# Patient Record
Sex: Female | Born: 1975 | Race: White | Hispanic: Yes | Marital: Married | Smoking: Current some day smoker
Health system: Southern US, Community
[De-identification: ages and names within clinical notes are randomized; demographics above are authoritative.]

## PROBLEM LIST (undated history)

## (undated) DIAGNOSIS — F329 Major depressive disorder, single episode, unspecified: Secondary | ICD-10-CM

## (undated) DIAGNOSIS — Z973 Presence of spectacles and contact lenses: Secondary | ICD-10-CM

## (undated) DIAGNOSIS — E559 Vitamin D deficiency, unspecified: Secondary | ICD-10-CM

## (undated) DIAGNOSIS — N939 Abnormal uterine and vaginal bleeding, unspecified: Secondary | ICD-10-CM

## (undated) DIAGNOSIS — N766 Ulceration of vulva: Secondary | ICD-10-CM

## (undated) DIAGNOSIS — N912 Amenorrhea, unspecified: Secondary | ICD-10-CM

## (undated) DIAGNOSIS — J310 Chronic rhinitis: Secondary | ICD-10-CM

## (undated) DIAGNOSIS — A6000 Herpesviral infection of urogenital system, unspecified: Secondary | ICD-10-CM

## (undated) DIAGNOSIS — E785 Hyperlipidemia, unspecified: Secondary | ICD-10-CM

## (undated) DIAGNOSIS — K219 Gastro-esophageal reflux disease without esophagitis: Secondary | ICD-10-CM

## (undated) DIAGNOSIS — K589 Irritable bowel syndrome without diarrhea: Secondary | ICD-10-CM

## (undated) DIAGNOSIS — M8580 Other specified disorders of bone density and structure, unspecified site: Secondary | ICD-10-CM

## (undated) DIAGNOSIS — F32A Depression, unspecified: Secondary | ICD-10-CM

## (undated) DIAGNOSIS — E0590 Thyrotoxicosis, unspecified without thyrotoxic crisis or storm: Secondary | ICD-10-CM

## (undated) DIAGNOSIS — N393 Stress incontinence (female) (male): Secondary | ICD-10-CM

## (undated) DIAGNOSIS — D649 Anemia, unspecified: Secondary | ICD-10-CM

## (undated) DIAGNOSIS — Z8639 Personal history of other endocrine, nutritional and metabolic disease: Secondary | ICD-10-CM

## (undated) DIAGNOSIS — F419 Anxiety disorder, unspecified: Secondary | ICD-10-CM

## (undated) HISTORY — DX: Chronic rhinitis: J31.0

## (undated) HISTORY — DX: Depression, unspecified: F32.A

## (undated) HISTORY — DX: Major depressive disorder, single episode, unspecified: F32.9

## (undated) HISTORY — DX: Vitamin D deficiency, unspecified: E55.9

## (undated) HISTORY — DX: Abnormal uterine and vaginal bleeding, unspecified: N93.9

## (undated) HISTORY — DX: Irritable bowel syndrome without diarrhea: K58.9

## (undated) HISTORY — DX: Anemia, unspecified: D64.9

## (undated) HISTORY — DX: Ulceration of vulva: N76.6

## (undated) HISTORY — DX: Other specified disorders of bone density and structure, unspecified site: M85.80

## (undated) HISTORY — DX: Stress incontinence (female) (male): N39.3

## (undated) HISTORY — DX: Anxiety disorder, unspecified: F41.9

## (undated) HISTORY — DX: Gastro-esophageal reflux disease without esophagitis: K21.9

## (undated) HISTORY — DX: Herpesviral infection of urogenital system, unspecified: A60.00

## (undated) HISTORY — DX: Thyrotoxicosis, unspecified without thyrotoxic crisis or storm: E05.90

## (undated) HISTORY — DX: Hyperlipidemia, unspecified: E78.5

## (undated) HISTORY — PX: NO PAST SURGERIES: SHX2092

## (undated) HISTORY — DX: Amenorrhea, unspecified: N91.2

---

## 2007-10-20 ENCOUNTER — Emergency Department (HOSPITAL_COMMUNITY): Admission: EM | Admit: 2007-10-20 | Discharge: 2007-10-20 | Payer: Self-pay | Admitting: Emergency Medicine

## 2008-01-23 ENCOUNTER — Emergency Department (HOSPITAL_COMMUNITY): Admission: EM | Admit: 2008-01-23 | Discharge: 2008-01-24 | Payer: Self-pay | Admitting: Emergency Medicine

## 2008-09-15 ENCOUNTER — Encounter: Payer: Self-pay | Admitting: Endocrinology

## 2008-10-08 ENCOUNTER — Encounter: Payer: Self-pay | Admitting: Endocrinology

## 2009-02-06 ENCOUNTER — Emergency Department (HOSPITAL_BASED_OUTPATIENT_CLINIC_OR_DEPARTMENT_OTHER): Admission: EM | Admit: 2009-02-06 | Discharge: 2009-02-06 | Payer: Self-pay | Admitting: Emergency Medicine

## 2009-02-06 ENCOUNTER — Ambulatory Visit: Payer: Self-pay | Admitting: Diagnostic Radiology

## 2009-02-06 ENCOUNTER — Emergency Department (HOSPITAL_COMMUNITY): Admission: EM | Admit: 2009-02-06 | Discharge: 2009-02-06 | Payer: Self-pay | Admitting: Emergency Medicine

## 2009-02-07 ENCOUNTER — Ambulatory Visit: Payer: Self-pay | Admitting: Radiology

## 2009-02-07 ENCOUNTER — Emergency Department (HOSPITAL_BASED_OUTPATIENT_CLINIC_OR_DEPARTMENT_OTHER): Admission: EM | Admit: 2009-02-07 | Discharge: 2009-02-07 | Payer: Self-pay | Admitting: Emergency Medicine

## 2009-02-23 ENCOUNTER — Ambulatory Visit: Payer: Self-pay | Admitting: Endocrinology

## 2009-02-23 DIAGNOSIS — A6000 Herpesviral infection of urogenital system, unspecified: Secondary | ICD-10-CM | POA: Insufficient documentation

## 2009-02-23 DIAGNOSIS — E042 Nontoxic multinodular goiter: Secondary | ICD-10-CM

## 2009-02-23 DIAGNOSIS — E0590 Thyrotoxicosis, unspecified without thyrotoxic crisis or storm: Secondary | ICD-10-CM | POA: Insufficient documentation

## 2009-02-23 DIAGNOSIS — K219 Gastro-esophageal reflux disease without esophagitis: Secondary | ICD-10-CM

## 2009-10-05 ENCOUNTER — Emergency Department (HOSPITAL_BASED_OUTPATIENT_CLINIC_OR_DEPARTMENT_OTHER): Admission: EM | Admit: 2009-10-05 | Discharge: 2009-10-05 | Payer: Self-pay | Admitting: Emergency Medicine

## 2010-09-16 LAB — URINALYSIS, ROUTINE W REFLEX MICROSCOPIC
Glucose, UA: NEGATIVE mg/dL
Hgb urine dipstick: NEGATIVE
Protein, ur: NEGATIVE mg/dL
Specific Gravity, Urine: 1.017 (ref 1.005–1.030)
pH: 8 (ref 5.0–8.0)

## 2010-09-16 LAB — COMPREHENSIVE METABOLIC PANEL
Albumin: 3.8 g/dL (ref 3.5–5.2)
Alkaline Phosphatase: 65 U/L (ref 39–117)
BUN: 12 mg/dL (ref 6–23)
CO2: 26 mEq/L (ref 19–32)
Chloride: 105 mEq/L (ref 96–112)
Creatinine, Ser: 0.7 mg/dL (ref 0.4–1.2)
GFR calc non Af Amer: >60 mL/min (ref >60)
Potassium: 3.9 mEq/L (ref 3.5–5.1)
Total Bilirubin: 0.3 mg/dL (ref 0.3–1.2)

## 2010-09-16 LAB — CBC
HCT: 38.3 % (ref 36.0–46.0)
Hemoglobin: 13.3 g/dL (ref 12.0–15.0)
MCV: 89 fL (ref 78.0–100.0)
RBC: 4.3 MIL/uL (ref 3.87–5.11)
WBC: 9.1 10*3/uL (ref 4.0–10.5)

## 2010-09-16 LAB — DIFFERENTIAL
Basophils Absolute: 0.1 10*3/uL (ref 0.0–0.1)
Basophils Relative: 1 % (ref 0–1)
Eosinophils Relative: 1 % (ref 0–5)
Lymphocytes Relative: 20 % (ref 12–46)
Monocytes Absolute: 0.5 10*3/uL (ref 0.1–1.0)
Neutro Abs: 6.6 10*3/uL (ref 1.7–7.7)

## 2010-09-16 LAB — LIPASE, BLOOD: Lipase: 36 U/L (ref 23–300)

## 2010-09-16 LAB — WET PREP, GENITAL

## 2010-09-16 LAB — URINE CULTURE
Colony Count: NO GROWTH
Culture: NO GROWTH

## 2011-03-09 LAB — CBC
MCHC: 33.7
MCV: 90.9
Platelets: 242
RBC: 3.86 — ABNORMAL LOW
WBC: 9.4

## 2011-06-04 ENCOUNTER — Emergency Department (HOSPITAL_COMMUNITY)
Admission: EM | Admit: 2011-06-04 | Discharge: 2011-06-04 | Disposition: A | Discharge status: Home / Self Care | Payer: BC Managed Care – PPO | Attending: Emergency Medicine | Admitting: Emergency Medicine

## 2011-06-04 ENCOUNTER — Encounter: Payer: Self-pay | Admitting: Emergency Medicine

## 2011-06-04 DIAGNOSIS — R51 Headache: Secondary | ICD-10-CM | POA: Insufficient documentation

## 2011-06-04 DIAGNOSIS — J069 Acute upper respiratory infection, unspecified: Secondary | ICD-10-CM | POA: Insufficient documentation

## 2011-06-04 DIAGNOSIS — R05 Cough: Secondary | ICD-10-CM | POA: Insufficient documentation

## 2011-06-04 DIAGNOSIS — R509 Fever, unspecified: Secondary | ICD-10-CM | POA: Insufficient documentation

## 2011-06-04 DIAGNOSIS — R059 Cough, unspecified: Secondary | ICD-10-CM | POA: Insufficient documentation

## 2011-06-04 MED ORDER — HYDROCOD POLST-CHLORPHEN POLST 10-8 MG/5ML PO LQCR: 5.0000 mL | Freq: Two times a day (BID) | ORAL | Status: DC | PRN

## 2011-06-04 MED ORDER — AZITHROMYCIN 250 MG PO TABS: 250.0000 mg | ORAL_TABLET | Freq: Every day | ORAL | Status: AC

## 2011-06-04 NOTE — ED Provider Notes (Signed)
History     CSN: [id number]  Arrival date & time 06/04/11  1191   First MD Initiated Contact with Patient 06/04/11 0756      Chief Complaint  Patient presents with  . Influenza    (Consider location/radiation/quality/duration/timing/severity/associated sxs/prior treatment) Patient is a 35 y.o. female presenting with flu symptoms. The history is provided by the patient.  Influenza     Pt presents to the ED with cough for 5 days. Pt states that she has cough and fever with headache. Denies sore throat, muscles aches, vomiting, diarrhea, nasal congestion. She has been eating and drinking. History reviewed. No pertinent past medical history.  History reviewed. No pertinent past surgical history.  History reviewed. No pertinent family history.  History  Substance Use Topics  . Smoking status: Not on file  . Smokeless tobacco: Not on file  . Alcohol Use: Not on file    OB History    Grav Para Term Preterm Abortions TAB SAB Ect Mult Living                  Review of Systems  All other systems reviewed and are negative.    Allergies  Review of patient's allergies indicates no known allergies.  Home Medications   Current Outpatient Rx  Name Route Sig Dispense Refill  . AZITHROMYCIN 250 MG PO TABS Oral Take 1 tablet (250 mg total) by mouth daily. Take first 2 tablets together, then 1 every day until finished. 6 tablet 0  . HYDROCOD POLST-CHLORPHEN POLST 10-8 MG/5ML PO LQCR Oral Take 5 mLs by mouth every 12 (twelve) hours as needed. 140 mL 0    BP 115/73  Pulse 111  Temp(Src) 98.6 F (37 C) (Oral)  Resp 22  Wt 127 lb 12.8 oz (57.97 kg)  SpO2 100%  LMP 05/28/2011  Physical Exam  Constitutional: She is oriented to person, place, and time. She appears well-developed and well-nourished.  HENT:  Head: Normocephalic and atraumatic.  Eyes: Conjunctivae are normal. Pupils are equal, round, and reactive to light.  Neck: Trachea normal, normal range of motion and  full passive range of motion without pain. Neck supple.  Cardiovascular: Normal rate, regular rhythm and normal pulses.   Pulmonary/Chest: Effort normal and breath sounds normal. No respiratory distress. She has no wheezes. She has no rales. Chest wall is not dull to percussion. She exhibits no tenderness, no crepitus, no edema, no deformity and no retraction.       Pt coughing during exam which sounds congested. Lung sounds clear to ascultation   Abdominal: Soft. Normal appearance and bowel sounds are normal.  Musculoskeletal: Normal range of motion.  Neurological: She is oriented to person, place, and time. She has normal strength.  Skin: Skin is warm, dry and intact.  Psychiatric: She has a normal mood and affect. Her speech is normal. Cognition and memory are normal.    ED Course  Procedures (including critical care time)  Labs Reviewed - No data to display No results found.   1. URI (upper respiratory infection)       MDM  Pt and her son both have same symptoms. Fevers with cough. Will treat with Azithromycin and Tussionex.        Dorthula Matas, PA 06/04/11 [phone]

## 2011-06-04 NOTE — ED Notes (Signed)
Flu symptoms for 4 days aching, coughing, fever headache

## 2011-06-04 NOTE — ED Notes (Signed)
Family at bedside. 

## 2011-06-05 NOTE — ED Provider Notes (Signed)
Medical screening examination/treatment/procedure(s) were performed by non-physician practitioner and as supervising physician I was immediately available for consultation/collaboration.  Myca Perno P Sherion Dooly, MD 06/05/11 0707 

## 2015-07-21 ENCOUNTER — Encounter: Payer: Self-pay | Admitting: Gastroenterology

## 2015-08-28 ENCOUNTER — Emergency Department (INDEPENDENT_AMBULATORY_CARE_PROVIDER_SITE_OTHER)
Admission: EM | Admit: 2015-08-28 | Discharge: 2015-08-28 | Disposition: A | Discharge status: Home / Self Care | Payer: Self-pay | Source: Home / Self Care | Attending: Emergency Medicine | Admitting: Emergency Medicine

## 2015-08-28 ENCOUNTER — Encounter (HOSPITAL_COMMUNITY): Payer: Self-pay | Admitting: Emergency Medicine

## 2015-08-28 DIAGNOSIS — S29012A Strain of muscle and tendon of back wall of thorax, initial encounter: Secondary | ICD-10-CM

## 2015-08-28 DIAGNOSIS — S39012A Strain of muscle, fascia and tendon of lower back, initial encounter: Secondary | ICD-10-CM

## 2015-08-28 MED ORDER — TRAMADOL HCL 50 MG PO TABS: 50.0000 mg | ORAL_TABLET | Freq: Four times a day (QID) | ORAL | Status: DC | PRN

## 2015-08-28 MED ORDER — CYCLOBENZAPRINE HCL 5 MG PO TABS: 5.0000 mg | ORAL_TABLET | Freq: Three times a day (TID) | ORAL | Status: DC | PRN
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
## 2015-08-28 NOTE — Discharge Instructions (Signed)
You have spasm and strain in your left back muscles. Take the Aleve regularly for the next several days. Apply heat as often as you can. Use Flexeril 3 times a day as needed for muscle spasm and pain. This medicine will make you drowsy. Use the tramadol for severe pain. Do not drive felt taking this medicine. You should see gradual improvement over the next week. Follow-up as needed.
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
## 2015-09-09 ENCOUNTER — Ambulatory Visit: Payer: Self-pay | Admitting: Gastroenterology

## 2015-10-21 ENCOUNTER — Other Ambulatory Visit: Payer: Self-pay | Admitting: Internal Medicine

## 2015-10-21 DIAGNOSIS — Z1231 Encounter for screening mammogram for malignant neoplasm of breast: Secondary | ICD-10-CM

## 2015-11-02 ENCOUNTER — Ambulatory Visit: Payer: Self-pay

## 2015-11-03 ENCOUNTER — Other Ambulatory Visit: Payer: Self-pay

## 2015-11-03 ENCOUNTER — Ambulatory Visit: Payer: Self-pay | Admitting: Gastroenterology

## 2015-11-11 ENCOUNTER — Ambulatory Visit
Admission: RE | Admit: 2015-11-11 | Discharge: 2015-11-11 | Disposition: A | Discharge status: Home / Self Care | Payer: BLUE CROSS/BLUE SHIELD | Source: Ambulatory Visit | Attending: Internal Medicine | Admitting: Internal Medicine

## 2015-11-11 DIAGNOSIS — Z1231 Encounter for screening mammogram for malignant neoplasm of breast: Secondary | ICD-10-CM

## 2016-01-06 ENCOUNTER — Encounter: Payer: Self-pay | Admitting: Gastroenterology

## 2016-03-13 ENCOUNTER — Other Ambulatory Visit: Payer: Self-pay

## 2016-03-13 ENCOUNTER — Ambulatory Visit: Payer: Self-pay | Admitting: Gastroenterology

## 2016-12-19 DIAGNOSIS — D649 Anemia, unspecified: Secondary | ICD-10-CM | POA: Diagnosis not present

## 2016-12-19 DIAGNOSIS — E039 Hypothyroidism, unspecified: Secondary | ICD-10-CM | POA: Insufficient documentation

## 2016-12-19 DIAGNOSIS — R112 Nausea with vomiting, unspecified: Secondary | ICD-10-CM | POA: Insufficient documentation

## 2016-12-19 DIAGNOSIS — R51 Headache: Secondary | ICD-10-CM | POA: Insufficient documentation

## 2016-12-19 DIAGNOSIS — F17210 Nicotine dependence, cigarettes, uncomplicated: Secondary | ICD-10-CM | POA: Diagnosis not present

## 2016-12-19 DIAGNOSIS — Z79899 Other long term (current) drug therapy: Secondary | ICD-10-CM | POA: Diagnosis not present

## 2016-12-20 ENCOUNTER — Encounter (HOSPITAL_COMMUNITY): Payer: Self-pay | Admitting: Emergency Medicine

## 2016-12-20 ENCOUNTER — Emergency Department (HOSPITAL_COMMUNITY)
Admission: EM | Admit: 2016-12-20 | Discharge: 2016-12-20 | Disposition: A | Discharge status: Home / Self Care | Payer: BLUE CROSS/BLUE SHIELD | Attending: Emergency Medicine | Admitting: Emergency Medicine

## 2016-12-20 DIAGNOSIS — R51 Headache: Secondary | ICD-10-CM

## 2016-12-20 DIAGNOSIS — R519 Headache, unspecified: Secondary | ICD-10-CM

## 2016-12-20 LAB — CBC WITH DIFFERENTIAL/PLATELET
Basophils Absolute: 0 10*3/uL (ref 0.0–0.1)
Basophils Relative: 0 %
EOS ABS: 0 10*3/uL (ref 0.0–0.7)
Eosinophils Relative: 0 %
HEMATOCRIT: 40.9 % (ref 36.0–46.0)
HEMOGLOBIN: 13.4 g/dL (ref 12.0–15.0)
LYMPHS ABS: 1.3 10*3/uL (ref 0.7–4.0)
Lymphocytes Relative: 9 %
MCH: 29.6 pg (ref 26.0–34.0)
MCHC: 32.8 g/dL (ref 30.0–36.0)
MCV: 90.5 fL (ref 78.0–100.0)
MONO ABS: 0.9 10*3/uL (ref 0.1–1.0)
MONOS PCT: 6 %
NEUTROS ABS: 12 10*3/uL — AB (ref 1.7–7.7)
NEUTROS PCT: 85 %
Platelets: 292 10*3/uL (ref 150–400)
RBC: 4.52 MIL/uL (ref 3.87–5.11)
RDW: 13.1 % (ref 11.5–15.5)
WBC: 14.3 10*3/uL — ABNORMAL HIGH (ref 4.0–10.5)

## 2016-12-20 LAB — BASIC METABOLIC PANEL
Anion gap: 11 (ref 5–15)
BUN: 9 mg/dL (ref 6–20)
CHLORIDE: 101 mmol/L (ref 101–111)
CO2: 24 mmol/L (ref 22–32)
CREATININE: 0.83 mg/dL (ref 0.44–1.00)
Calcium: 8.8 mg/dL — ABNORMAL LOW (ref 8.9–10.3)
GFR calc non Af Amer: >60 mL/min (ref >60)
GLUCOSE: 121 mg/dL — AB (ref 65–99)
Potassium: 3.7 mmol/L (ref 3.5–5.1)
Sodium: 136 mmol/L (ref 135–145)

## 2016-12-20 MED ORDER — DIPHENHYDRAMINE HCL 50 MG/ML IJ SOLN
INTRAMUSCULAR | Status: AC
  Administered 2016-12-20: 02:00:00
  Filled 2016-12-20: qty 1

## 2016-12-20 MED ORDER — MAGNESIUM SULFATE 2 GM/50ML IV SOLN
INTRAVENOUS | Status: AC
  Administered 2016-12-20: 02:00:00
  Filled 2016-12-20: qty 50

## 2016-12-20 MED ORDER — METOCLOPRAMIDE HCL 5 MG/ML IJ SOLN
INTRAMUSCULAR | Status: AC
  Administered 2016-12-20: 10 mg
  Filled 2016-12-20: qty 2

## 2016-12-20 MED ORDER — DEXAMETHASONE SODIUM PHOSPHATE 10 MG/ML IJ SOLN
INTRAMUSCULAR | Status: AC
  Administered 2016-12-20: 10 mg
  Filled 2016-12-20: qty 1

## 2016-12-20 NOTE — ED Triage Notes (Signed)
Pt reports worst headache of her life since last night.  C/o nausea, vomiting, blurred vision, light sensitivity, and neck pain.  States [facility] and had head CT.  States she received injection and has had no relief with that and pain medicine they gave her.

## 2016-12-20 NOTE — ED Provider Notes (Signed)
[facility]-EMERGENCY DEPT Provider Note   CSN: [id number] Arrival date & time: 12/19/16  2339    By signing my name below, I, Freida Busman, attest that this documentation has been prepared under the direction and in the presence of Pricilla Loveless, MD . Electronically Signed: Freida Busman, Scribe. 12/20/2016. 1:26 AM.  History   Chief Complaint Chief Complaint  Patient presents with  . Headache  . Neck Pain    The history is provided by the patient. No language interpreter was used.    HPI Comments:  Caitlin Hoffman is a 41 y.o. female who presents to the Emergency Department complaining of constant HA x 2 nights. She describes 10/10, throbbing pain from L temple across  to R temple. Her pain is worse with certain movements. Pt reports associated photophobia, neck pain, nausea, and vomiting. She saw PCP and had an injection for pain (toradol) at 1100 yesterday AM (12/19/2016) without relief. She had a CT that was negative per phone results called to her. She has also been taking sumatriptan once tonight without relief.  No h/o same pain or migraine HA. She states she has had the occasional milder HA in the past but they have always been relieved by aleve. No fever, unilateral weakness, or blurry vision.  No neck stiffness.  Past Medical History:  Diagnosis Date  . Abnormal vaginal bleeding   . Amenorrhea   . Anemia   . Anxiety   . Depression   . Genital herpes   . Genital ulcer, female   . GERD (gastroesophageal reflux disease)   . Hyperlipemia   . Hyperthyroidism   . IBS (irritable bowel syndrome)   . Osteopenia   . Rhinitis   . Stress incontinence   . Vitamin D deficiency     Patient Active Problem List   Diagnosis Date Noted  . GENITAL HERPES 02/23/2009  . GOITER, MULTINODULAR 02/23/2009  . HYPERTHYROIDISM 02/23/2009  . GERD 02/23/2009    History reviewed. No pertinent surgical history.  OB History    No data available       Home Medications    Prior to  Admission medications   Medication Sig Start Date End Date Taking? Authorizing Provider  chlorpheniramine-HYDROcodone (TUSSIONEX PENNKINETIC ER) 10-8 MG/5ML LQCR Take 5 mLs by mouth every 12 (twelve) hours as needed. Patient not taking: Reported on 12/20/2016 06/04/11   Marlon Pel, PA-C  cyclobenzaprine (FLEXERIL) 5 MG tablet Take 1-2 tablets (5-10 mg total) by mouth 3 (three) times daily as needed for muscle spasms. Patient not taking: Reported on 12/20/2016 08/28/15   Charm Rings, MD  traMADol (ULTRAM) 50 MG tablet Take 1 tablet (50 mg total) by mouth every 6 (six) hours as needed. Patient not taking: Reported on 12/20/2016 08/28/15   Charm Rings, MD    Family History Family History  Problem Relation Age of Onset  . Gastric cancer Mother        Stomach cancer?    Social History Social History  Substance Use Topics  . Smoking status: Current Every Day Smoker    Types: Cigarettes  . Smokeless tobacco: Never Used  . Alcohol use 0.0 oz/week     Comment: occ     Allergies   Patient has no known allergies.   Review of Systems Review of Systems  Constitutional: Negative for fever.  Eyes: Positive for photophobia. Negative for visual disturbance.  Gastrointestinal: Positive for nausea and vomiting.  Musculoskeletal: Positive for neck pain.  Neurological: Positive for headaches. Negative  for weakness.  All other systems reviewed and are negative.    Physical Exam Updated Vital Signs BP 126/79   Pulse 86   Temp 98.2 F (36.8 C) (Oral)   Resp 20   LMP 12/13/2016   SpO2 97%   Physical Exam  Constitutional: She is oriented to person, place, and time. She appears well-developed and well-nourished.  HENT:  Head: Normocephalic and atraumatic.  Right Ear: External ear normal.  Left Ear: External ear normal.  Nose: Nose normal.  Eyes: Pupils are equal, round, and reactive to light. EOM are normal. Right eye exhibits no discharge. Left eye exhibits no discharge.    photophobia  Neck: Normal range of motion. Neck supple.  No stiffness/meningismus  Cardiovascular: Normal rate, regular rhythm and normal heart sounds.   Pulmonary/Chest: Effort normal and breath sounds normal.  Abdominal: Soft. There is no tenderness.  Neurological: She is alert and oriented to person, place, and time.  CN 3-12 grossly intact. 5/5 strength in all 4 extremities. Grossly normal sensation. Normal finger to nose.   Skin: Skin is warm and dry.  Nursing note and vitals reviewed.    ED Treatments / Results  DIAGNOSTIC STUDIES:  Oxygen Saturation is 99% on RA, normal by my interpretation.    COORDINATION OF CARE:  1:25 AM Discussed treatment plan with pt at bedside and pt agreed to plan.  Labs (all labs ordered are listed, but only abnormal results are displayed) Labs Reviewed  BASIC METABOLIC PANEL - Abnormal; Notable for the following:       Result Value   Glucose, Bld 121 (*)    Calcium 8.8 (*)    All other components within normal limits  CBC WITH DIFFERENTIAL/PLATELET - Abnormal; Notable for the following:    WBC 14.3 (*)    Neutro Abs 12.0 (*)    All other components within normal limits    EKG  EKG Interpretation None       Radiology No results found.  Procedures Procedures (including critical care time)  Medications Ordered in ED Medications  diphenhydrAMINE (BENADRYL) 50 MG/ML injection (  Given During Downtime 12/20/16 0155)  dexamethasone (DECADRON) 10 MG/ML injection (10 mg  Given During Downtime 12/20/16 0155)  metoCLOPramide (REGLAN) 5 MG/ML injection (10 mg  Given During Downtime 12/20/16 0155)  magnesium sulfate 2 GM/50ML IVPB (  Started During Downtime 12/20/16 0218)     Initial Impression / Assessment and Plan / ED Course  I have reviewed the triage vital signs and the nursing notes.  Pertinent labs & imaging results that were available during my care of the patient were reviewed by me and considered in my medical decision making  (see chart for details).     Patient appears to have a significant headache but is otherwise well appearing. She is afebrile, not currently vomiting. Minimal relief with Reglan, Benadryl, Decadron and so she was given Depakote and magnesium. This has had a better effect. The headache is still present but is much milder and she feels comfortable going home. I am not able to access the CT results for given that they are reportedly negative I do not think repeat imaging would be beneficial in this scenario. It has now been about 24 hours since the onset anyway. Given this and given the CT was about 12 hours after onset I discussed lumbar puncture testing to help rule out subarachnoid hemorrhage. Having meningitis is less likely but could also be ruled out. I discussed risk and benefits and after  she thought it over she declines. She understands risks of missed diagnoses such as meningitis and subarachnoid hemorrhage, including up to neurologic disability and death. She is awake, alert, appropriate, and seems capable of medical decision-making. I discussed she can and should return at any time if her symptoms do not improve or worsen. Endorse close PCP follow-up. Strict return precautions.  Final Clinical Impressions(s) / ED Diagnoses   Final diagnoses:  Frontal headache    New Prescriptions New Prescriptions   No medications on file   I personally performed the services described in this documentation, which was scribed in my presence. The recorded information has been reviewed and is accurate.     Pricilla Loveless, MD 12/20/16 [phone]

## 2016-12-20 NOTE — Discharge Instructions (Signed)
If your headache worsens or you develop fever, vomiting, neck stiffness, or weakness/numbness, return to the ER immediately. If your headache does not improve in the next 24-48 hours, see a physician either in the ED or at your primary care office. See your primary care doctor in the next 2 days.

## 2017-03-12 ENCOUNTER — Other Ambulatory Visit: Payer: Self-pay | Admitting: Nurse Practitioner

## 2017-03-12 ENCOUNTER — Other Ambulatory Visit: Payer: Self-pay | Admitting: Internal Medicine

## 2017-03-12 DIAGNOSIS — Z1231 Encounter for screening mammogram for malignant neoplasm of breast: Secondary | ICD-10-CM

## 2017-03-22 ENCOUNTER — Ambulatory Visit
Admission: RE | Admit: 2017-03-22 | Discharge: 2017-03-22 | Disposition: A | Discharge status: Home / Self Care | Payer: BLUE CROSS/BLUE SHIELD | Source: Ambulatory Visit | Attending: Nurse Practitioner | Admitting: Nurse Practitioner

## 2017-03-22 DIAGNOSIS — Z1231 Encounter for screening mammogram for malignant neoplasm of breast: Secondary | ICD-10-CM

## 2018-12-03 ENCOUNTER — Ambulatory Visit
Admission: EM | Admit: 2018-12-03 | Discharge: 2018-12-03 | Disposition: A | Discharge status: Home / Self Care | Payer: BC Managed Care – PPO | Attending: Emergency Medicine | Admitting: Emergency Medicine

## 2018-12-03 ENCOUNTER — Telehealth: Payer: Self-pay | Admitting: *Deleted

## 2018-12-03 ENCOUNTER — Other Ambulatory Visit: Payer: Self-pay

## 2018-12-03 DIAGNOSIS — F17210 Nicotine dependence, cigarettes, uncomplicated: Secondary | ICD-10-CM

## 2018-12-03 DIAGNOSIS — J019 Acute sinusitis, unspecified: Secondary | ICD-10-CM | POA: Diagnosis not present

## 2018-12-03 DIAGNOSIS — Z20822 Contact with and (suspected) exposure to covid-19: Secondary | ICD-10-CM

## 2018-12-03 MED ORDER — CETIRIZINE HCL 10 MG PO TABS: 10.0000 mg | ORAL_TABLET | Freq: Every day | ORAL | 0 refills | Status: DC

## 2018-12-03 MED ORDER — FLUTICASONE PROPIONATE 50 MCG/ACT NA SUSP: 1.0000 | Freq: Every day | NASAL | 2 refills | Status: DC

## 2018-12-03 MED ORDER — METHYLPREDNISOLONE ACETATE 80 MG/ML IJ SUSP
80.0000 mg | Freq: Once | INTRAMUSCULAR | Status: AC
  Administered 2018-12-03: 15:00:00 80 mg via INTRAMUSCULAR

## 2018-12-03 NOTE — ED Provider Notes (Signed)
[facility]    CSN: [id number] Arrival date & time: 12/03/18  1419     History   Chief Complaint Chief Complaint  Patient presents with  . Nasal Congestion    HPI Kiyona A Camelia PhenesMercado is a 43 y.o. female.   Malijah A Camelia PhenesMercado presents with complaints of nasal congestion. Started 6/20. No shortness of breath . Loss of taste. No fever. Headaches. Congestion. Has had similar in the past. High level of stress last week prior to onset of symptoms. Has been taking mucinex, helped some. Amoxicillin 500 twice a day since onset for congestion. Has had similar in the past and has required injection to help with congestion in the past. No cough. No gi/ gu complaints. No chest pain. Has not been working. Has been going to church, members had tested positive. Lives alone with son. Symptoms generally have improved slightly. Has been resting and drinking a lot of fluids. Has been taking vitamin c. Took aleve a few days ago but has had to take since. Hx of anxiety, depression, gerd, hyperthyroidism, rhinitis, ibs.     ROS per HPI, negative if not otherwise mentioned.      Past Medical History:  Diagnosis Date  . Abnormal vaginal bleeding   . Amenorrhea   . Anemia   . Anxiety   . Depression   . Genital herpes   . Genital ulcer, female   . GERD (gastroesophageal reflux disease)   . Hyperlipemia   . Hyperthyroidism   . IBS (irritable bowel syndrome)   . Osteopenia   . Rhinitis   . Stress incontinence   . Vitamin D deficiency     Patient Active Problem List   Diagnosis Date Noted  . GENITAL HERPES 02/23/2009  . GOITER, MULTINODULAR 02/23/2009  . HYPERTHYROIDISM 02/23/2009  . GERD 02/23/2009    History reviewed. No pertinent surgical history.  OB History   No obstetric history on file.      Home Medications    Prior to Admission medications   Medication Sig Start Date End Date Taking? Authorizing Provider  cetirizine (ZYRTEC) 10 MG tablet Take 1 tablet (10 mg  total) by mouth daily. 12/03/18   Georgetta HaberBurky, Kapil Petropoulos B, NP  chlorpheniramine-HYDROcodone (TUSSIONEX PENNKINETIC ER) 10-8 MG/5ML LQCR Take 5 mLs by mouth every 12 (twelve) hours as needed. Patient not taking: Reported on 12/20/2016 06/04/11   Marlon PelGreene, Tiffany, PA-C  cyclobenzaprine (FLEXERIL) 5 MG tablet Take 1-2 tablets (5-10 mg total) by mouth 3 (three) times daily as needed for muscle spasms. Patient not taking: Reported on 12/20/2016 08/28/15   Charm RingsHonig, Erin J, MD  fluticasone [facility](FLONASE) 50 MCG/ACT nasal spray Place 1 spray into both nostrils daily. 12/03/18   Georgetta HaberBurky, Talulah Schirmer B, NP  traMADol (ULTRAM) 50 MG tablet Take 1 tablet (50 mg total) by mouth every 6 (six) hours as needed. Patient not taking: Reported on 12/20/2016 08/28/15   Charm RingsHonig, Erin J, MD    Family History Family History  Problem Relation Age of Onset  . Gastric cancer Mother        Stomach cancer?    Social History Social History   Tobacco Use  . Smoking status: Current Every Day Smoker    Types: Cigarettes  . Smokeless tobacco: Never Used  Substance Use Topics  . Alcohol use: Yes    Alcohol/week: 0.0 standard drinks    Comment: occ  . Drug use: No     Allergies   Patient has no known allergies.   Review of Systems  Review of Systems   Physical Exam Triage Vital Signs ED Triage Vitals [12/03/18 1427]  Enc Vitals Group     BP 109/76     Pulse Rate 96     Resp 18     Temp 98.5 F (36.9 C)     Temp Source Oral     SpO2 95 %     Weight      Height      Head Circumference      Peak Flow      Pain Score 0     Pain Loc      Pain Edu?      Excl. in [place]?    No data found.  Updated Vital Signs BP 109/76 (BP Location: Left Arm)   Pulse 96   Temp 98.5 F (36.9 C) (Oral)   Resp 18   LMP 11/12/2018   SpO2 95%    Physical Exam Constitutional:      General: She is not in acute distress.    Appearance: She is well-developed.  HENT:     Head: Normocephalic and atraumatic.     Right Ear: Tympanic membrane, ear  canal and external ear normal.     Left Ear: Tympanic membrane, ear canal and external ear normal.     Nose: Nose normal.     Mouth/Throat:     Pharynx: Uvula midline.     Tonsils: No tonsillar exudate.  Eyes:     Conjunctiva/sclera: Conjunctivae normal.     Pupils: Pupils are equal, round, and reactive to light.  Cardiovascular:     Rate and Rhythm: Normal rate.  Pulmonary:     Effort: Pulmonary effort is normal.  Skin:    General: Skin is warm and dry.  Neurological:     Mental Status: She is alert and oriented to person, place, and time.      UC Treatments / Results  Labs (all labs ordered are listed, but only abnormal results are displayed) Labs Reviewed - No data to display  EKG None  Radiology No results found.  Procedures Procedures (including critical care time)  Medications Ordered in UC Medications  methylPREDNISolone acetate (DEPO-MEDROL) injection 80 mg (80 mg Intramuscular Given 12/03/18 1505)    Initial Impression / Assessment and Plan / UC Course  I have reviewed the triage vital signs and the nursing notes.  Pertinent labs & imaging results that were available during my care of the patient were reviewed by me and considered in my medical decision making (see chart for details).     Non toxic. Benign physical exam.  History and physical consistent with viral illness.  im depo medrol provided to help with sinus pressure. covid testing ordered. Continue with supportive cares. Return precautions provided. Patient verbalized understanding and agreeable to plan.   Final Clinical Impressions(s) / UC Diagnoses   Final diagnoses:  Acute sinusitis, recurrence not specified, unspecified location     Discharge Instructions     Push fluids to ensure adequate hydration and keep secretions thin.  Tylenol and/or ibuprofen as needed for pain.  Daily zyrtec as well as daily flonase to help with symptoms.  You will be called to set up an appointment time to be  tested for Covid-19 at our [facility]. Results take about 2-3 days.  Please self isolate until you receive this results.   If symptoms worsen or do not improve in the next week to return to be seen or to follow up with your  PCP.      ED Prescriptions    Medication Sig Dispense Auth. Provider   fluticasone (FLONASE) 50 MCG/ACT nasal spray Place 1 spray into both nostrils daily. 16 g Linus MakoBurky, Ridgely Anastacio B, NP   cetirizine (ZYRTEC) 10 MG tablet Take 1 tablet (10 mg total) by mouth daily. 30 tablet Georgetta HaberBurky, Kaimani Clayson B, NP     Controlled Substance Prescriptions [place] Controlled Substance Registry consulted? Not Applicable   Georgetta HaberBurky, Suhailah Kwan B, NP 12/03/18 1517

## 2018-12-03 NOTE — Addendum Note (Signed)
Addended by: Torrie Mayers on: 12/03/2018 04:13 PM   Modules accepted: Orders

## 2018-12-03 NOTE — Discharge Instructions (Signed)
Push fluids to ensure adequate hydration and keep secretions thin.  Tylenol and/or ibuprofen as needed for pain.  Daily zyrtec as well as daily flonase to help with symptoms.  You will be called to set up an appointment time to be tested for Covid-19 at our [facility]. Results take about 2-3 days.  Please self isolate until you receive this results.   If symptoms worsen or do not improve in the next week to return to be seen or to follow up with your PCP.

## 2018-12-03 NOTE — Telephone Encounter (Signed)
LM for patient to return our call @ [phone] M-F 7a-7p to schedule covid testing. Order placed

## 2018-12-03 NOTE — Telephone Encounter (Signed)
-----   Message from Zigmund Gottron, NP sent at 12/03/2018  2:58 PM EDT -----

## 2018-12-03 NOTE — ED Triage Notes (Signed)
Pt c/o head/nasal congestion with sneezing and lost of taste x 5 days. States took PCN 3days and OTC meds with no relief

## 2019-02-09 ENCOUNTER — Emergency Department (HOSPITAL_BASED_OUTPATIENT_CLINIC_OR_DEPARTMENT_OTHER): Payer: Medicaid Other

## 2019-02-09 ENCOUNTER — Other Ambulatory Visit: Payer: Self-pay

## 2019-02-09 ENCOUNTER — Emergency Department (HOSPITAL_BASED_OUTPATIENT_CLINIC_OR_DEPARTMENT_OTHER)
Admission: EM | Admit: 2019-02-09 | Discharge: 2019-02-10 | Disposition: A | Discharge status: Home / Self Care | Payer: Medicaid Other | Attending: Emergency Medicine | Admitting: Emergency Medicine

## 2019-02-09 ENCOUNTER — Encounter (HOSPITAL_BASED_OUTPATIENT_CLINIC_OR_DEPARTMENT_OTHER): Payer: Self-pay

## 2019-02-09 DIAGNOSIS — Z79899 Other long term (current) drug therapy: Secondary | ICD-10-CM | POA: Diagnosis not present

## 2019-02-09 DIAGNOSIS — F17210 Nicotine dependence, cigarettes, uncomplicated: Secondary | ICD-10-CM | POA: Diagnosis not present

## 2019-02-09 DIAGNOSIS — R109 Unspecified abdominal pain: Secondary | ICD-10-CM | POA: Diagnosis present

## 2019-02-09 DIAGNOSIS — E785 Hyperlipidemia, unspecified: Secondary | ICD-10-CM | POA: Insufficient documentation

## 2019-02-09 DIAGNOSIS — N12 Tubulo-interstitial nephritis, not specified as acute or chronic: Secondary | ICD-10-CM | POA: Diagnosis not present

## 2019-02-09 LAB — CBC WITH DIFFERENTIAL/PLATELET
Abs Immature Granulocytes: 0.05 10*3/uL (ref 0.00–0.07)
Basophils Absolute: 0.1 10*3/uL (ref 0.0–0.1)
Basophils Relative: 0 %
Eosinophils Absolute: 0 10*3/uL (ref 0.0–0.5)
Eosinophils Relative: 0 %
HCT: 37.8 % (ref 36.0–46.0)
Hemoglobin: 12.4 g/dL (ref 12.0–15.0)
Immature Granulocytes: 0 %
Lymphocytes Relative: 18 %
Lymphs Abs: 2.5 10*3/uL (ref 0.7–4.0)
MCH: 29.7 pg (ref 26.0–34.0)
MCHC: 32.8 g/dL (ref 30.0–36.0)
MCV: 90.6 fL (ref 80.0–100.0)
Monocytes Absolute: 1.3 10*3/uL — ABNORMAL HIGH (ref 0.1–1.0)
Monocytes Relative: 10 %
Neutro Abs: 9.6 10*3/uL — ABNORMAL HIGH (ref 1.7–7.7)
Neutrophils Relative %: 72 %
Platelets: 311 10*3/uL (ref 150–400)
RBC: 4.17 MIL/uL (ref 3.87–5.11)
RDW: 14 % (ref 11.5–15.5)
WBC: 13.5 10*3/uL — ABNORMAL HIGH (ref 4.0–10.5)
nRBC: 0 % (ref 0.0–0.2)

## 2019-02-09 LAB — URINALYSIS, ROUTINE W REFLEX MICROSCOPIC
Bilirubin Urine: NEGATIVE
Glucose, UA: NEGATIVE mg/dL
Hgb urine dipstick: NEGATIVE
Ketones, ur: 15 mg/dL — AB
Nitrite: NEGATIVE
Protein, ur: NEGATIVE mg/dL
Specific Gravity, Urine: 1.015 (ref 1.005–1.030)
pH: 7.5 (ref 5.0–8.0)

## 2019-02-09 LAB — COMPREHENSIVE METABOLIC PANEL
ALT: 27 U/L (ref 0–44)
AST: 21 U/L (ref 15–41)
Albumin: 3.8 g/dL (ref 3.5–5.0)
Alkaline Phosphatase: 97 U/L (ref 38–126)
Anion gap: 10 (ref 5–15)
BUN: 10 mg/dL (ref 6–20)
CO2: 23 mmol/L (ref 22–32)
Calcium: 8.7 mg/dL — ABNORMAL LOW (ref 8.9–10.3)
Chloride: 99 mmol/L (ref 98–111)
Creatinine, Ser: 0.61 mg/dL (ref 0.44–1.00)
GFR calc Af Amer: >60 mL/min (ref >60)
GFR calc non Af Amer: >60 mL/min (ref >60)
Glucose, Bld: 116 mg/dL — ABNORMAL HIGH (ref 70–99)
Potassium: 4.1 mmol/L (ref 3.5–5.1)
Sodium: 132 mmol/L — ABNORMAL LOW (ref 135–145)
Total Bilirubin: 0.6 mg/dL (ref 0.3–1.2)
Total Protein: 7.6 g/dL (ref 6.5–8.1)

## 2019-02-09 LAB — LIPASE, BLOOD: Lipase: 31 U/L (ref 11–51)

## 2019-02-09 LAB — URINALYSIS, MICROSCOPIC (REFLEX): RBC / HPF: NONE SEEN RBC/hpf (ref 0–5)

## 2019-02-09 LAB — PREGNANCY, URINE: Preg Test, Ur: NEGATIVE

## 2019-02-09 MED ORDER — FENTANYL CITRATE (PF) 100 MCG/2ML IJ SOLN
50.0000 ug | Freq: Once | INTRAMUSCULAR | Status: AC
  Administered 2019-02-09: 23:00:00 50 ug via INTRAVENOUS
  Filled 2019-02-09: qty 2

## 2019-02-09 MED ORDER — IOHEXOL 300 MG/ML  SOLN
100.0000 mL | Freq: Once | INTRAMUSCULAR | Status: AC | PRN
  Administered 2019-02-09: 23:00:00 100 mL via INTRAVENOUS

## 2019-02-09 MED ORDER — ONDANSETRON HCL 4 MG/2ML IJ SOLN
4.0000 mg | Freq: Once | INTRAMUSCULAR | Status: AC
  Administered 2019-02-09: 23:00:00 4 mg via INTRAVENOUS
  Filled 2019-02-09: qty 2

## 2019-02-09 NOTE — ED Notes (Signed)
Patient transported to CT 

## 2019-02-09 NOTE — ED Triage Notes (Signed)
Pt c/o right flank, mid back pain started 4 days ago-denies injury-NAD-steady gait

## 2019-02-09 NOTE — ED Provider Notes (Signed)
[facility] EMERGENCY DEPARTMENT Provider Note   CSN: [id number] Arrival date & time: 02/09/19  2134     History   Chief Complaint Chief Complaint  Patient presents with  . Flank Pain    HPI Caitlin Hoffman is a 43 y.o. female.     Patient is a 43 year old female who presents with right flank pain.  She says it started about 4 days ago.  Is been in her right back and radiates to her right mid abdomen.  She says she also has some radiation down her right leg and some cramping in her leg intermittently.  She denies any numbness or weakness in the leg.  She denies any vaginal bleeding or discharge.  No known fevers at home although she had a temperature of 100.3 here in the emergency department.  She is had some associated nausea and vomiting.  Started 4 days ago and is progressively gotten worse.  She was seen by her OB/GYN about a week ago because she had heavy periods.  At that time she was having some burning on urination and was diagnosed with a UTI and started on antibiotics.  Her vaginal bleeding has subsided.     Past Medical History:  Diagnosis Date  . Abnormal vaginal bleeding   . Amenorrhea   . Anemia   . Anxiety   . Depression   . Genital herpes   . Genital ulcer, female   . GERD (gastroesophageal reflux disease)   . Hyperlipemia   . Hyperthyroidism   . IBS (irritable bowel syndrome)   . Osteopenia   . Rhinitis   . Stress incontinence   . Vitamin D deficiency     Patient Active Problem List   Diagnosis Date Noted  . GENITAL HERPES 02/23/2009  . GOITER, MULTINODULAR 02/23/2009  . HYPERTHYROIDISM 02/23/2009  . GERD 02/23/2009    History reviewed. No pertinent surgical history.   OB History   No obstetric history on file.      Home Medications    Prior to Admission medications   Medication Sig Start Date End Date Taking? Authorizing Provider  cetirizine (ZYRTEC) 10 MG tablet Take 1 tablet (10 mg total) by mouth daily. 12/03/18   Georgetta Haber, NP  chlorpheniramine-HYDROcodone (TUSSIONEX PENNKINETIC ER) 10-8 MG/5ML LQCR Take 5 mLs by mouth every 12 (twelve) hours as needed. Patient not taking: Reported on 12/20/2016 06/04/11   Marlon Pel, PA-C  cyclobenzaprine (FLEXERIL) 5 MG tablet Take 1-2 tablets (5-10 mg total) by mouth 3 (three) times daily as needed for muscle spasms. Patient not taking: Reported on 12/20/2016 08/28/15   Charm Rings, MD  fluticasone [facility]) 50 MCG/ACT nasal spray Place 1 spray into both nostrils daily. 12/03/18   Georgetta Haber, NP  traMADol (ULTRAM) 50 MG tablet Take 1 tablet (50 mg total) by mouth every 6 (six) hours as needed. Patient not taking: Reported on 12/20/2016 08/28/15   Charm Rings, MD    Family History Family History  Problem Relation Age of Onset  . Gastric cancer Mother        Stomach cancer?    Social History Social History   Tobacco Use  . Smoking status: Current Every Day Smoker    Types: Cigarettes  . Smokeless tobacco: Never Used  Substance Use Topics  . Alcohol use: Yes    Alcohol/week: 0.0 standard drinks    Comment: occ  . Drug use: No     Allergies   Patient has no  known allergies.   Review of Systems Review of Systems  Constitutional: Negative for chills, diaphoresis, fatigue and fever.  HENT: Negative for congestion, rhinorrhea and sneezing.   Eyes: Negative.   Respiratory: Negative for cough, chest tightness and shortness of breath.   Cardiovascular: Negative for chest pain and leg swelling.  Gastrointestinal: Positive for abdominal pain, nausea and vomiting. Negative for blood in stool and diarrhea.  Genitourinary: Negative for difficulty urinating, flank pain, frequency and hematuria.  Musculoskeletal: Positive for back pain. Negative for arthralgias.  Skin: Negative for rash.  Neurological: Negative for dizziness, speech difficulty, weakness, numbness and headaches.     Physical Exam Updated Vital Signs BP 109/66 (BP Location: Left  Arm)   Pulse 98   Temp 100.3 F (37.9 C) (Oral)   Resp 20   Ht 5\' 4"  (1.626 m)   Wt 62.1 kg   LMP 01/31/2019   SpO2 98%   BMI 23.52 kg/m   Physical Exam Constitutional:      Appearance: She is well-developed.  HENT:     Head: Normocephalic and atraumatic.  Eyes:     Pupils: Pupils are equal, round, and reactive to light.  Neck:     Musculoskeletal: Normal range of motion and neck supple.  Cardiovascular:     Rate and Rhythm: Normal rate and regular rhythm.     Heart sounds: Normal heart sounds.  Pulmonary:     Effort: Pulmonary effort is normal. No respiratory distress.     Breath sounds: Normal breath sounds. No wheezing or rales.  Chest:     Chest wall: No tenderness.  Abdominal:     General: Bowel sounds are normal.     Palpations: Abdomen is soft.     Tenderness: There is abdominal tenderness. There is no guarding or rebound.     Comments: Positive tenderness in the right midabdomen and right upper quadrant, there is also tenderness in the right flank and CVA area.  Musculoskeletal: Normal range of motion.     Comments: Tenderness along the right mid and lower back.  There is no spinal tenderness.  Negative straight leg raise bilaterally.  Patellar reflexes symmetric bilaterally.  She has normal sensation and motor function distally.  Pedal pulses are intact.  Lymphadenopathy:     Cervical: No cervical adenopathy.  Skin:    General: Skin is warm and dry.     Findings: No rash.  Neurological:     Mental Status: She is alert and oriented to person, place, and time.      ED Treatments / Results  Labs (all labs ordered are listed, but only abnormal results are displayed) Labs Reviewed  URINALYSIS, ROUTINE W REFLEX MICROSCOPIC - Abnormal; Notable for the following components:      Result Value   APPearance CLOUDY (*)    Ketones, ur 15 (*)    Leukocytes,Ua SMALL (*)    All other components within normal limits  URINALYSIS, MICROSCOPIC (REFLEX) - Abnormal;  Notable for the following components:   Bacteria, UA MANY (*)    All other components within normal limits  CBC WITH DIFFERENTIAL/PLATELET - Abnormal; Notable for the following components:   WBC 13.5 (*)    Neutro Abs 9.6 (*)    Monocytes Absolute 1.3 (*)    All other components within normal limits  PREGNANCY, URINE  COMPREHENSIVE METABOLIC PANEL  LIPASE, BLOOD    EKG None  Radiology No results found.  Procedures Procedures (including critical care time)  Medications Ordered in ED  Medications  fentaNYL (SUBLIMAZE) injection 50 mcg (50 mcg Intravenous Given 02/09/19 2238)  ondansetron (ZOFRAN) injection 4 mg (4 mg Intravenous Given 02/09/19 2237)     Initial Impression / Assessment and Plan / ED Course  I have reviewed the triage vital signs and the nursing notes.  Pertinent labs & imaging results that were available during my care of the patient were reviewed by me and considered in my medical decision making (see chart for details).       Pt awaiting labs/CT scan.  Dr. Florina Ou to follow.   Final Clinical Impressions(s) / ED Diagnoses   Final diagnoses:  None    ED Discharge Orders    None       Malvin Johns, MD 02/09/19 2248

## 2019-02-10 MED ORDER — CIPROFLOXACIN HCL 500 MG PO TABS: 500.0000 mg | ORAL_TABLET | Freq: Two times a day (BID) | ORAL | 0 refills | Status: DC

## 2019-02-10 MED ORDER — CIPROFLOXACIN HCL 500 MG PO TABS
500.0000 mg | ORAL_TABLET | Freq: Once | ORAL | Status: AC
  Administered 2019-02-10: 01:00:00 500 mg via ORAL
  Filled 2019-02-10: qty 1

## 2019-02-10 MED ORDER — HYDROCODONE-ACETAMINOPHEN 5-325 MG PO TABS
1.0000 | ORAL_TABLET | Freq: Once | ORAL | Status: AC
  Administered 2019-02-10: 01:00:00 1 via ORAL
  Filled 2019-02-10: qty 1

## 2019-02-10 MED ORDER — HYDROCODONE-ACETAMINOPHEN 5-325 MG PO TABS: 1.0000 | ORAL_TABLET | Freq: Four times a day (QID) | ORAL | 0 refills | Status: DC | PRN

## 2019-02-10 NOTE — ED Provider Notes (Signed)
Nursing notes and vitals signs, including pulse oximetry, reviewed.  Summary of this visit's results, reviewed by myself:  EKG:  EKG Interpretation  Date/Time:    Ventricular Rate:    PR Interval:    QRS Duration:   QT Interval:    QTC Calculation:   R Axis:     Text Interpretation:         Labs:  Results for orders placed or performed during the hospital encounter of 02/09/19 (from the past 24 hour(s))  Urinalysis, Routine w reflex microscopic     Status: Abnormal   Collection Time: 02/09/19  9:51 PM  Result Value Ref Range   Color, Urine YELLOW YELLOW   APPearance CLOUDY (A) CLEAR   Specific Gravity, Urine 1.015 1.005 - 1.030   pH 7.5 5.0 - 8.0   Glucose, UA NEGATIVE NEGATIVE mg/dL   Hgb urine dipstick NEGATIVE NEGATIVE   Bilirubin Urine NEGATIVE NEGATIVE   Ketones, ur 15 (A) NEGATIVE mg/dL   Protein, ur NEGATIVE NEGATIVE mg/dL   Nitrite NEGATIVE NEGATIVE   Leukocytes,Ua SMALL (A) NEGATIVE  Pregnancy, urine     Status: None   Collection Time: 02/09/19  9:51 PM  Result Value Ref Range   Preg Test, Ur NEGATIVE NEGATIVE  Urinalysis, Microscopic (reflex)     Status: Abnormal   Collection Time: 02/09/19  9:51 PM  Result Value Ref Range   RBC / HPF NONE SEEN 0 - 5 RBC/hpf   WBC, UA 11-20 0 - 5 WBC/hpf   Bacteria, UA MANY (A) NONE SEEN   Squamous Epithelial / LPF 6-10 0 - 5  Comprehensive metabolic panel     Status: Abnormal   Collection Time: 02/09/19 10:38 PM  Result Value Ref Range   Sodium 132 (L) 135 - 145 mmol/L   Potassium 4.1 3.5 - 5.1 mmol/L   Chloride 99 98 - 111 mmol/L   CO2 23 22 - 32 mmol/L   Glucose, Bld 116 (H) 70 - 99 mg/dL   BUN 10 6 - 20 mg/dL   Creatinine, Ser 0.61 0.44 - 1.00 mg/dL   Calcium 8.7 (L) 8.9 - 10.3 mg/dL   Total Protein 7.6 6.5 - 8.1 g/dL   Albumin 3.8 3.5 - 5.0 g/dL   AST 21 15 - 41 U/L   ALT 27 0 - 44 U/L   Alkaline Phosphatase 97 38 - 126 U/L   Total Bilirubin 0.6 0.3 - 1.2 mg/dL   GFR calc non Af Amer >60 >60 mL/min   GFR  calc Af Amer >60 >60 mL/min   Anion gap 10 5 - 15  Lipase, blood     Status: None   Collection Time: 02/09/19 10:38 PM  Result Value Ref Range   Lipase 31 11 - 51 U/L  CBC with Differential     Status: Abnormal   Collection Time: 02/09/19 10:38 PM  Result Value Ref Range   WBC 13.5 (H) 4.0 - 10.5 K/uL   RBC 4.17 3.87 - 5.11 MIL/uL   Hemoglobin 12.4 12.0 - 15.0 g/dL   HCT 37.8 36.0 - 46.0 %   MCV 90.6 80.0 - 100.0 fL   MCH 29.7 26.0 - 34.0 pg   MCHC 32.8 30.0 - 36.0 g/dL   RDW 14.0 11.5 - 15.5 %   Platelets 311 150 - 400 K/uL   nRBC 0.0 0.0 - 0.2 %   Neutrophils Relative % 72 %   Neutro Abs 9.6 (H) 1.7 - 7.7 K/uL   Lymphocytes Relative 18 %  Lymphs Abs 2.5 0.7 - 4.0 K/uL   Monocytes Relative 10 %   Monocytes Absolute 1.3 (H) 0.1 - 1.0 K/uL   Eosinophils Relative 0 %   Eosinophils Absolute 0.0 0.0 - 0.5 K/uL   Basophils Relative 0 %   Basophils Absolute 0.1 0.0 - 0.1 K/uL   Immature Granulocytes 0 %   Abs Immature Granulocytes 0.05 0.00 - 0.07 K/uL    Imaging Studies: Ct Abdomen Pelvis W Contrast  Result Date: 02/10/2019 CLINICAL DATA:  Right-sided abdominal/flank pain. EXAM: CT ABDOMEN AND PELVIS WITH CONTRAST TECHNIQUE: Multidetector CT imaging of the abdomen and pelvis was performed using the standard protocol following bolus administration of intravenous contrast. CONTRAST:  100mL OMNIPAQUE IOHEXOL 300 MG/ML  SOLN COMPARISON:  None. FINDINGS: Lower chest: Linear atelectasis in the left lower lobe. Heart is normal in size. Hepatobiliary: No focal liver abnormality is seen. No gallstones, gallbladder wall thickening, or biliary dilatation. Pancreas: No ductal dilatation or inflammation. Spleen: Normal in size without focal abnormality. Adrenals/Urinary Tract: Normal adrenal glands. Heterogeneous enhancement of the right kidney with areas of decreased attenuation suspicious for pyelonephritis. No perirenal fluid collection. Mild right ureteral thickening and enhancement. Left  kidney is unremarkable. Urinary bladder is physiologically distended. No bladder wall thickening. Stomach/Bowel: Stomach is within normal limits. Portions of normal appendix are seen, for example series 5, image 40. No appendicitis. No evidence of bowel wall thickening, distention, or inflammatory changes. Vascular/Lymphatic: Abdominal aorta is normal in caliber. The portal vein is patent. No enlarged lymph nodes in the abdomen or pelvis. Reproductive: Heterogeneous enhancement of the uterus without dominant fibroid. Physiologic cyst in the right ovary. Left ovary is normal. Other: No free air, free fluid, or intra-abdominal fluid collection. Small fat containing umbilical hernia. Musculoskeletal: There are no acute or suspicious osseous abnormalities. IMPRESSION: Findings consistent with right pyelonephritis.  No renal abscess. Electronically Signed   By: Narda RutherfordMelanie  Sanford M.D.   On: 02/10/2019 00:14   12:22 AM Patient's pain improved after fentanyl.  Patient still has right CVA tenderness.  CT scan consistent with pyelonephritis.  She admits to being noncompliant with the antibiotic prescribed for a recent urinary tract infection.  She thinks the medication started with an M.  This would suggest Macrobid which is not a good treatment for pyelonephritis.  We will switch her to ciprofloxacin.   Gwyn Hieronymus, Jonny RuizJohn, MD 02/10/19 [id number]

## 2019-02-13 LAB — URINE CULTURE: Culture: >=100000 — AB

## 2019-02-14 ENCOUNTER — Telehealth: Payer: Self-pay

## 2019-02-14 NOTE — Telephone Encounter (Signed)
Post ED Visit - Positive Culture Follow-up  Culture report reviewed by antimicrobial stewardship pharmacist: [place] Team []  Elenor Quinones, Pharm.D. []  Heide Guile, Pharm.D., BCPS AQ-ID []  Parks Neptune, Pharm.D., BCPS []  Alycia Rossetti, Pharm.D., BCPS []  [place], Pharm.D., BCPS, AAHIVP []  Legrand Como, Pharm.D., BCPS, AAHIVP []  Salome Arnt, PharmD, BCPS []  Johnnette Gourd, PharmD, BCPS [x]  Hughes Better, PharmD, BCPS []  Leeroy Cha, PharmD []  Laqueta Linden, PharmD, BCPS []  Albertina Parr, PharmD  [place] Team []  Leodis Sias, PharmD []  Lindell Spar, PharmD []  Royetta Asal, PharmD []  Graylin Shiver, Rph []  Rema Fendt) Glennon Mac, PharmD []  Arlyn Dunning, PharmD []  Netta Cedars, PharmD []  Dia Sitter, PharmD []  Leone Haven, PharmD []  Gretta Arab, PharmD []  Theodis Shove, PharmD []  Peggyann Juba, PharmD []  Reuel Boom, PharmD   Positive urine culture Treated with Ciprofloxacin, organism sensitive to the same and no further patient follow-up is required at this time.  Genia Del 02/14/2019, 11:46 AM

## 2019-06-25 ENCOUNTER — Other Ambulatory Visit: Payer: Self-pay | Admitting: Obstetrics & Gynecology

## 2019-07-02 ENCOUNTER — Encounter (HOSPITAL_BASED_OUTPATIENT_CLINIC_OR_DEPARTMENT_OTHER): Admission: RE | Payer: Self-pay | Source: Home / Self Care

## 2019-07-02 ENCOUNTER — Ambulatory Visit (HOSPITAL_BASED_OUTPATIENT_CLINIC_OR_DEPARTMENT_OTHER)
Admission: RE | Admit: 2019-07-02 | Payer: Medicaid Other | Source: Home / Self Care | Admitting: Obstetrics & Gynecology

## 2019-07-02 SURGERY — TRANSVAGINAL TAPE (TVT) PROCEDURE
Anesthesia: Choice

## 2020-05-24 ENCOUNTER — Other Ambulatory Visit: Payer: Self-pay | Admitting: Obstetrics & Gynecology

## 2020-06-20 NOTE — Progress Notes (Signed)
Pt called and left voice message stating she will need to reschedule her surgery because her husband has coivd.  Called pt back and left voice message that she will to call Dr Mora Appl office and speak with her OR scheduler , gave number.

## 2020-06-24 ENCOUNTER — Ambulatory Visit (HOSPITAL_BASED_OUTPATIENT_CLINIC_OR_DEPARTMENT_OTHER)
Admission: RE | Admit: 2020-06-24 | Payer: Medicaid Other | Source: Home / Self Care | Admitting: Obstetrics & Gynecology

## 2020-06-24 ENCOUNTER — Encounter (HOSPITAL_BASED_OUTPATIENT_CLINIC_OR_DEPARTMENT_OTHER): Admission: RE | Payer: Self-pay | Source: Home / Self Care

## 2020-06-24 SURGERY — TRANSVAGINAL TAPE (TVT) PROCEDURE
Anesthesia: Choice

## 2020-07-21 ENCOUNTER — Other Ambulatory Visit: Payer: Self-pay | Admitting: Obstetrics & Gynecology

## 2020-08-17 ENCOUNTER — Encounter (HOSPITAL_BASED_OUTPATIENT_CLINIC_OR_DEPARTMENT_OTHER): Payer: Self-pay | Admitting: Obstetrics & Gynecology

## 2020-08-18 ENCOUNTER — Encounter (HOSPITAL_BASED_OUTPATIENT_CLINIC_OR_DEPARTMENT_OTHER): Payer: Self-pay | Admitting: Obstetrics & Gynecology

## 2020-08-18 ENCOUNTER — Other Ambulatory Visit (HOSPITAL_COMMUNITY)
Admission: RE | Admit: 2020-08-18 | Discharge: 2020-08-18 | Disposition: A | Discharge status: Home / Self Care | Payer: Medicaid Other | Source: Ambulatory Visit | Attending: Obstetrics & Gynecology | Admitting: Obstetrics & Gynecology

## 2020-08-18 ENCOUNTER — Other Ambulatory Visit: Payer: Self-pay

## 2020-08-18 DIAGNOSIS — Z01812 Encounter for preprocedural laboratory examination: Secondary | ICD-10-CM | POA: Insufficient documentation

## 2020-08-18 DIAGNOSIS — Z20822 Contact with and (suspected) exposure to covid-19: Secondary | ICD-10-CM | POA: Diagnosis not present

## 2020-08-18 LAB — SARS CORONAVIRUS 2 (TAT 6-24 HRS): SARS Coronavirus 2: NEGATIVE

## 2020-08-18 NOTE — Progress Notes (Signed)
Spoke w/ via phone for pre-op interview--- PT Lab needs dos----  CBC, Urine preg             Lab results------ no COVID test ------ 08-18-2020 @ 1405 Arrive at ------- 1045 on 08-22-2020 NPO after MN NO Solid Food.  Clear liquids from MN until--- 0945 Med rec completed Medications to take morning of surgery ----- NONE Diabetic medication ----- n/a Patient instructed to bring photo id and insurance card day of surgery Patient aware to have Driver (ride ) / caregiver    for 24 hours after surgery -- husband, Caitlin Hoffman Patient Special Instructions ----- n/a Pre-Op special Istructions ----- n/a Patient verbalized understanding of instructions that were given at this phone interview. Patient denies shortness of breath, chest pain, fever, cough at this phone interview.

## 2020-08-22 ENCOUNTER — Ambulatory Visit (HOSPITAL_BASED_OUTPATIENT_CLINIC_OR_DEPARTMENT_OTHER): Payer: Medicaid Other | Admitting: Anesthesiology

## 2020-08-22 ENCOUNTER — Encounter (HOSPITAL_BASED_OUTPATIENT_CLINIC_OR_DEPARTMENT_OTHER): Payer: Self-pay | Admitting: Obstetrics & Gynecology

## 2020-08-22 ENCOUNTER — Ambulatory Visit (HOSPITAL_BASED_OUTPATIENT_CLINIC_OR_DEPARTMENT_OTHER)
Admission: RE | Admit: 2020-08-22 | Discharge: 2020-08-22 | Disposition: A | Discharge status: Home / Self Care | Payer: Medicaid Other | Attending: Obstetrics & Gynecology | Admitting: Obstetrics & Gynecology

## 2020-08-22 ENCOUNTER — Encounter (HOSPITAL_BASED_OUTPATIENT_CLINIC_OR_DEPARTMENT_OTHER)
Admission: RE | Disposition: A | Discharge status: Home / Self Care | Payer: Self-pay | Source: Home / Self Care | Attending: Obstetrics & Gynecology

## 2020-08-22 DIAGNOSIS — N393 Stress incontinence (female) (male): Secondary | ICD-10-CM | POA: Diagnosis present

## 2020-08-22 DIAGNOSIS — Z87891 Personal history of nicotine dependence: Secondary | ICD-10-CM | POA: Insufficient documentation

## 2020-08-22 HISTORY — PX: CYSTOSCOPY: SHX5120

## 2020-08-22 HISTORY — DX: Presence of spectacles and contact lenses: Z97.3

## 2020-08-22 HISTORY — PX: BLADDER SUSPENSION: SHX72

## 2020-08-22 HISTORY — DX: Stress incontinence (female) (male): N39.3

## 2020-08-22 HISTORY — DX: Personal history of other endocrine, nutritional and metabolic disease: Z86.39

## 2020-08-22 LAB — CBC
HCT: 42.1 % (ref 36.0–46.0)
Hemoglobin: 14 g/dL (ref 12.0–15.0)
MCH: 29.3 pg (ref 26.0–34.0)
MCHC: 33.3 g/dL (ref 30.0–36.0)
MCV: 88.1 fL (ref 80.0–100.0)
Platelets: 282 10*3/uL (ref 150–400)
RBC: 4.78 MIL/uL (ref 3.87–5.11)
RDW: 13 % (ref 11.5–15.5)
WBC: 7.1 10*3/uL (ref 4.0–10.5)
nRBC: 0 % (ref 0.0–0.2)

## 2020-08-22 LAB — POCT PREGNANCY, URINE: Preg Test, Ur: NEGATIVE

## 2020-08-22 SURGERY — TRANSVAGINAL TAPE (TVT) PROCEDURE
Anesthesia: General | Site: Vagina

## 2020-08-22 MED ORDER — ONDANSETRON HCL 4 MG/2ML IJ SOLN
INTRAMUSCULAR | Status: AC
  Filled 2020-08-22: qty 2

## 2020-08-22 MED ORDER — KETOROLAC TROMETHAMINE 30 MG/ML IJ SOLN
INTRAMUSCULAR | Status: DC | PRN
  Administered 2020-08-22: 30 mg via INTRAVENOUS

## 2020-08-22 MED ORDER — OXYCODONE HCL 5 MG PO TABS: 5.0000 mg | ORAL_TABLET | Freq: Once | ORAL | Status: DC | PRN

## 2020-08-22 MED ORDER — BUPIVACAINE HCL (PF) 0.5 % IJ SOLN
INTRAMUSCULAR | Status: DC | PRN
  Administered 2020-08-22: 54 mL

## 2020-08-22 MED ORDER — ACETAMINOPHEN 10 MG/ML IV SOLN
INTRAVENOUS | Status: DC | PRN
  Administered 2020-08-22: 1000 mg via INTRAVENOUS

## 2020-08-22 MED ORDER — ONDANSETRON HCL 4 MG/2ML IJ SOLN
INTRAMUSCULAR | Status: DC | PRN
  Administered 2020-08-22: 4 mg via INTRAVENOUS

## 2020-08-22 MED ORDER — EPHEDRINE 5 MG/ML INJ
INTRAVENOUS | Status: AC
  Filled 2020-08-22: qty 10

## 2020-08-22 MED ORDER — FENTANYL CITRATE (PF) 100 MCG/2ML IJ SOLN
INTRAMUSCULAR | Status: AC
  Filled 2020-08-22: qty 2

## 2020-08-22 MED ORDER — ONDANSETRON HCL 4 MG/2ML IJ SOLN: 4.0000 mg | Freq: Once | INTRAMUSCULAR | Status: DC | PRN

## 2020-08-22 MED ORDER — LIDOCAINE 2% (20 MG/ML) 5 ML SYRINGE
INTRAMUSCULAR | Status: DC | PRN
  Administered 2020-08-22: 100 mg via INTRAVENOUS

## 2020-08-22 MED ORDER — PROPOFOL 10 MG/ML IV BOLUS
INTRAVENOUS | Status: DC | PRN
  Administered 2020-08-22: 150 mg via INTRAVENOUS

## 2020-08-22 MED ORDER — FENTANYL CITRATE (PF) 100 MCG/2ML IJ SOLN: 25.0000 ug | INTRAMUSCULAR | Status: DC | PRN

## 2020-08-22 MED ORDER — LIDOCAINE-EPINEPHRINE 1 %-1:100000 IJ SOLN
INTRAMUSCULAR | Status: DC | PRN
  Administered 2020-08-22: 5 mL

## 2020-08-22 MED ORDER — AMISULPRIDE (ANTIEMETIC) 5 MG/2ML IV SOLN: 10.0000 mg | Freq: Once | INTRAVENOUS | Status: DC | PRN

## 2020-08-22 MED ORDER — DEXAMETHASONE SODIUM PHOSPHATE 10 MG/ML IJ SOLN
INTRAMUSCULAR | Status: AC
  Filled 2020-08-22: qty 1

## 2020-08-22 MED ORDER — EPHEDRINE SULFATE-NACL 50-0.9 MG/10ML-% IV SOSY
PREFILLED_SYRINGE | INTRAVENOUS | Status: DC | PRN
  Administered 2020-08-22: 10 mg via INTRAVENOUS
  Administered 2020-08-22: 5 mg via INTRAVENOUS

## 2020-08-22 MED ORDER — MIDAZOLAM HCL 2 MG/2ML IJ SOLN
INTRAMUSCULAR | Status: AC
  Filled 2020-08-22: qty 2

## 2020-08-22 MED ORDER — DEXAMETHASONE SODIUM PHOSPHATE 10 MG/ML IJ SOLN
INTRAMUSCULAR | Status: DC | PRN
  Administered 2020-08-22: 10 mg via INTRAVENOUS

## 2020-08-22 MED ORDER — CEFAZOLIN SODIUM-DEXTROSE 2-4 GM/100ML-% IV SOLN
2.0000 g | INTRAVENOUS | Status: AC
  Administered 2020-08-22: 2 g via INTRAVENOUS

## 2020-08-22 MED ORDER — CEFAZOLIN SODIUM-DEXTROSE 2-4 GM/100ML-% IV SOLN
INTRAVENOUS | Status: AC
  Filled 2020-08-22: qty 100

## 2020-08-22 MED ORDER — ACETAMINOPHEN 10 MG/ML IV SOLN
INTRAVENOUS | Status: AC
  Filled 2020-08-22: qty 100

## 2020-08-22 MED ORDER — SODIUM CHLORIDE 0.9 % IR SOLN
Status: DC | PRN
  Administered 2020-08-22: 1000 mL via INTRAVESICAL

## 2020-08-22 MED ORDER — OXYCODONE-ACETAMINOPHEN 5-325 MG PO TABS: ORAL_TABLET | ORAL | 0 refills | Status: DC

## 2020-08-22 MED ORDER — IBUPROFEN 600 MG PO TABS: ORAL_TABLET | ORAL | 1 refills | Status: DC

## 2020-08-22 MED ORDER — POVIDONE-IODINE 10 % EX SWAB: 2.0000 "application " | Freq: Once | CUTANEOUS | Status: DC

## 2020-08-22 MED ORDER — MIDAZOLAM HCL 2 MG/2ML IJ SOLN
INTRAMUSCULAR | Status: DC | PRN
  Administered 2020-08-22: 2 mg via INTRAVENOUS

## 2020-08-22 MED ORDER — FENTANYL CITRATE (PF) 100 MCG/2ML IJ SOLN
INTRAMUSCULAR | Status: DC | PRN
  Administered 2020-08-22: 50 ug via INTRAVENOUS
  Administered 2020-08-22 (×2): 25 ug via INTRAVENOUS

## 2020-08-22 MED ORDER — LIDOCAINE 2% (20 MG/ML) 5 ML SYRINGE
INTRAMUSCULAR | Status: AC
  Filled 2020-08-22: qty 5

## 2020-08-22 MED ORDER — OXYCODONE HCL 5 MG/5ML PO SOLN: 5.0000 mg | Freq: Once | ORAL | Status: DC | PRN

## 2020-08-22 MED ORDER — KETOROLAC TROMETHAMINE 30 MG/ML IJ SOLN
INTRAMUSCULAR | Status: AC
  Filled 2020-08-22: qty 1

## 2020-08-22 MED ORDER — LACTATED RINGERS IV SOLN: INTRAVENOUS | Status: DC

## 2020-08-22 SURGICAL SUPPLY — 29 items
ADH SKN CLS APL DERMABOND .7 (GAUZE/BANDAGES/DRESSINGS) ×2
AGENT HMST KT MTR STRL THRMB (HEMOSTASIS)
BLADE SURG 11 STRL SS (BLADE) ×3 IMPLANT
BLADE SURG 15 STRL LF DISP TIS (BLADE) ×2 IMPLANT
BLADE SURG 15 STRL SS (BLADE) ×3
CANISTER SUCT 3000ML PPV (MISCELLANEOUS) IMPLANT
CATH FOLEY 2WAY SLVR 18FR 30CC (CATHETERS) ×3 IMPLANT
COVER WAND RF STERILE (DRAPES) ×3 IMPLANT
DECANTER SPIKE VIAL GLASS SM (MISCELLANEOUS) ×3 IMPLANT
DERMABOND ADVANCED (GAUZE/BANDAGES/DRESSINGS) ×1
DERMABOND ADVANCED .7 DNX12 (GAUZE/BANDAGES/DRESSINGS) ×2 IMPLANT
GAUZE PACKING 2X5 YD STRL (GAUZE/BANDAGES/DRESSINGS) IMPLANT
GLOVE SURG ENC MOIS LTX SZ6.5 (GLOVE) ×3 IMPLANT
GLOVE SURG UNDER POLY LF SZ7 (GLOVE) ×6 IMPLANT
GOWN STRL REUS W/TWL LRG LVL3 (GOWN DISPOSABLE) ×9 IMPLANT
KIT TURNOVER CYSTO (KITS) ×3 IMPLANT
MANIFOLD NEPTUNE II (INSTRUMENTS) ×3 IMPLANT
NEEDLE SPNL 22GX3.5 QUINCKE BK (NEEDLE) ×3 IMPLANT
NS IRRIG 1000ML POUR BTL (IV SOLUTION) ×3 IMPLANT
PACK VAGINAL WOMENS (CUSTOM PROCEDURE TRAY) ×3 IMPLANT
SET IRRIG Y TYPE TUR BLADDER L (SET/KITS/TRAYS/PACK) ×3 IMPLANT
SLING TVT EXACT (Sling) ×3 IMPLANT
SURGIFLO W/THROMBIN 8M KIT (HEMOSTASIS) IMPLANT
SUT MNCRL AB 3-0 PS2 27 (SUTURE) ×3 IMPLANT
SUT VIC AB 0 CT1 18XCR BRD8 (SUTURE) ×2 IMPLANT
SUT VIC AB 0 CT1 8-18 (SUTURE) ×3
SUT VIC AB 2-0 UR6 27 (SUTURE) IMPLANT
TOWEL OR 17X26 10 PK STRL BLUE (TOWEL DISPOSABLE) ×6 IMPLANT
TRAY FOLEY W/BAG SLVR 14FR LF (SET/KITS/TRAYS/PACK) IMPLANT

## 2020-08-22 NOTE — Progress Notes (Addendum)
Inserted 16 Fr. Foley cath without difficulty. Pt tolerated well. Returned 1100 ml clear yellow drainage. Catie Cornetto RN present for time out. Discharge instructions provided and supplies for changing to leg bag.

## 2020-08-22 NOTE — Op Note (Signed)
OPERATIVE NOTE  Caitlin Hoffman  DOB:    January 16, 1976  MRN:    [id number]  CSN:    [id number]  Date of Surgery:  08/22/2020  Preoperative Diagnosis: Urinary Stress Incontinence   Postoperative Diagnosis: Same as above  Procedure: TRANSVAGINAL TAPE (TVT) PROCEDURE, CYSTOSCOPY  Surgeon:  Delila Spence. Patrina Andreas MD  Assistant: Henreitta Leber, PA  Anesthetic: General LMA  Estimated Blood Loss:  20 mL         Drains: Foley during case         Total IV Fluids: 900 ml  Blood Given:  None         Specimens: None         Implants: Gynecare TVT mesh Lot # [id number]        Complications:  None         Disposition:  PACU then home         Condition:  good    Disposition:  The patient presents with the above-mentioned diagnosis. She understands the indications for surgical procedure.  She also understands the alternative treatment options. She accepts the risk of, but not limited to, anesthetic complications, bleeding, infections, and possible damage to the surrounding organs.  Operative Findings: Normal external vulva; normal vaginal vault good pink ruggae, urethral hypermobility.  Cystoscopy: normal urothelium no injury during TVT placement. Brisk jets seen from bilateral ureteral orifices, normal urethral mucosa no injury.    Procedure: Patient brought to operating room where general anesthesia was given and found to be adequate. Time out was done confirming patient name, date of birth and procedure.   Place patient in dorsal lithotomy avoiding hip flexion greater than 60. The 41 French Foley catheter was placed and bladder emptied.   The exit points were marked 2 cm on each side of the midline immediately above the pubic symphysis. 30 cc of 0.25 % Marcaine  local anesthesia was injected using a spinal needle into the exit sites and passing the needle along the back of the pubic symphysis until the needle touched the endopelvic fascia.   1% lidocaine with epinephrine was injected  submucosally at the level of the mid urethra, creating a space between the vaginal wall and the periurethral fascia. A sagittal incision no more than 1.5 cm long starting at 1.0 cm  cephalad from the urethral meatus was performed using a 15 blade scalpel. Two 0.5 cm paraurethral lateral dissections were performed using Metzenbaum scissors to allow the subsequent passage of the implant.   The bladder was confirmed to be empty and the GYNECARE TVT Reusable Rigid Catheter Guide was inserted into  the channel of the 18 French Foley catheter. The tip of Foley catheter was pushed gently toward the posterior lateral wall of the bladder opposite to the intended Trocar Sheath passage in order to displace the bladder to the contralateral side and prevent injury.  The Trocar Sheath was secured to the Trocar Handle by hooking the Trocar Sheath Cut-out onto the Trocar Sheath Lock on the Trocar Handle.  The Trocar Shaft was placed inside one of the two white Trocar Sheaths, using the dominant hand to hold the Trocar Handle and the  non-dominant hand to control initial insertion of the device by placing index finger under the anterior vaginal wall, just lateral of the suburethral incision.  The Trocar Sheath Tip was oriented horizontally in the frontal plane during the initial submucosal passage in the paraurethral dissected space and the tip of the Trocar sheath passed through until it  reached the end of the dissected space.  The urogenital diaphragm was perforated ad the Trocar handle lowered to ensure that the Trocar Sheath tip passed vertically, staying in close contact with the back of the pubic symphysis. The Trocar Sheath Tip was advance through the urogenital diaphragm into the retropubic space and aimed towards the pre-marked abdominal exit sites.  The tip was advanced through the retropubic space, the rectus muscle and skin and then grasped with a Kelly clamp. The Trocar Sheath was pushed laterally off the lock and  the needle / shaft was withdrawn. The 60 French Foley catheter was removed and a cystoscopy was performed to confirm bladder integrity.   The above procedure was repeated on the contralateral side. The Trocar Sheaths were gently pulled upwards to bring the sling loosely under the mid urethra. The sling was carefully positioned into place. Care was taken to insure that the sling was not twisted in any way, and the Sheaths were removed.  Mayo scissors were placed between the urethra and the sling itself during tensioning of the sling to insure proper placement and that it was in fact tension free. The sling was then cut to the level of the skin, the skin edges were lifted up to ensure that mesh was subdermal and the skin was closed using Dermabond. The vaginal mucosa was then closed using 3-0 Monocryl on a subcuticular stitch and excellent hemostasis was achieved.   Naoma Diener Joee Iovine

## 2020-08-22 NOTE — Anesthesia Preprocedure Evaluation (Signed)
Anesthesia Evaluation  Patient identified by MRN, date of birth, ID band Patient awake    Reviewed: Allergy & Precautions, NPO status , Patient's Chart, lab work & pertinent test results  History of Anesthesia Complications Negative for: history of anesthetic complications  Airway Mallampati: II  TM Distance: >3 FB Neck ROM: Full    Dental  (+) Teeth Intact   Pulmonary neg pulmonary ROS, former smoker,    Pulmonary exam normal        Cardiovascular negative cardio ROS Normal cardiovascular exam     Neuro/Psych negative neurological ROS     GI/Hepatic Neg liver ROS, GERD  ,  Endo/Other  negative endocrine ROS  Renal/GU negative Renal ROS  negative genitourinary   Musculoskeletal negative musculoskeletal ROS (+)   Abdominal   Peds  Hematology negative hematology ROS (+)   Anesthesia Other Findings   Reproductive/Obstetrics                             Anesthesia Physical Anesthesia Plan  ASA: II  Anesthesia Plan: General   Post-op Pain Management:    Induction: Intravenous  PONV Risk Score and Plan: 3 and Ondansetron, Dexamethasone, Midazolam and Treatment may vary due to age or medical condition  Airway Management Planned: LMA  Additional Equipment: None  Intra-op Plan:   Post-operative Plan: Extubation in OR  Informed Consent: I have reviewed the patients History and Physical, chart, labs and discussed the procedure including the risks, benefits and alternatives for the proposed anesthesia with the patient or authorized representative who has indicated his/her understanding and acceptance.     Dental advisory given  Plan Discussed with:   Anesthesia Plan Comments:         Anesthesia Quick Evaluation

## 2020-08-22 NOTE — Progress Notes (Signed)
Dr. Mora Appl aware of inability to void after 3 attempts and bladder scan of 629 ml and . Orders given for foley cath insertion and to return to office on Wednesday, August 24, 2020 for removal.

## 2020-08-22 NOTE — Anesthesia Procedure Notes (Signed)
Procedure Name: LMA Insertion Date/Time: 08/22/2020 12:51 PM Performed by: Norva Pavlov, CRNA Pre-anesthesia Checklist: Patient identified, Emergency Drugs available, Suction available and Patient being monitored Patient Re-evaluated:Patient Re-evaluated prior to induction Oxygen Delivery Method: Circle system utilized Preoxygenation: Pre-oxygenation with 100% oxygen Induction Type: IV induction Ventilation: Mask ventilation without difficulty LMA: LMA inserted LMA Size: 4.0 Number of attempts: 1 Airway Equipment and Method: Bite block Placement Confirmation: positive ETCO2 Tube secured with: Tape Dental Injury: Teeth and Oropharynx as per pre-operative assessment

## 2020-08-22 NOTE — Discharge Instructions (Signed)
Call [facility] @ [phone] if:  You have a temperature greater than or equal to 100.4 degrees Farenheit orally You have pain that is not made better by the pain medication given and taken as directed You have excessive bleeding or problems urinating  Take Colace (Docusate Sodium/Stool Softener) 100 mg 2-3 times daily while taking narcotic pain medicine to avoid constipation or until bowel movements are regular. Take with food, for the next 5 days, Ibuprofen 600 mg every 6 hours;  then as needed for pain  You may drive after 1 week You may walk up steps  You may shower tomorrow You may resume a regular diet  Keep incisions clean and dry Do not lift over 15 pounds for 6 weeks Avoid anything in vagina for 6 weeks (or until after your post-operative visit)  No Tylenol until after 7 PM today if needed.   HOME CARE INSTRUCTIONS FOR TVT  Activity:             -No sexual intercourse until your f/u visit Diet:  You may return to your normal diet tomorrow.   It is important to keep your bowels regular during the postoperative period.  To avoid constipation, drink plenty of fluids during the day (8-10 glasses) and eat plenty of fresh fruits and vegetables.  Use a mild laxative or stool softener if necessary.  Wound Care:  You may begin showering tomorrow, or as instructed by your physician.      Return to Work as instructed by your physician.    Special Instructions:   Call your physician if any of these symptoms occur:   -temperature greater than 101 degrees Farenheit.   -redness, swelling or drainage at incision site.   -foul odor of your urine.   -a significant decrease in the amount of urine you have every day.   -severe pain not relieved by your pain medication.  Post Anesthesia Home Care Instructions  Activity: Get plenty of rest for the remainder of the day. A responsible individual must stay with you for 24 hours following the procedure.  For the next 24  hours, DO NOT: -Drive a car -Advertising copywriter -Drink alcoholic beverages -Take any medication unless instructed by your physician -Make any legal decisions or sign important papers.  Meals: Start with liquid foods such as gelatin or soup. Progress to regular foods as tolerated. Avoid greasy, spicy, heavy foods. If nausea and/or vomiting occur, drink only clear liquids until the nausea and/or vomiting subsides. Call your physician if vomiting continues.  Special Instructions/Symptoms: Your throat may feel dry or sore from the anesthesia or the breathing tube placed in your throat during surgery. If this causes discomfort, gargle with warm salt water. The discomfort should disappear within 24 hours.  If you had a scopolamine patch placed behind your ear for the management of post- operative nausea and/or vomiting:  1. The medication in the patch is effective for 72 hours, after which it should be removed.  Wrap patch in a tissue and discard in the trash. Wash hands thoroughly with soap and water. 2. You may remove the patch earlier than 72 hours if you experience unpleasant side effects which may include dry mouth, dizziness or visual disturbances. 3. Avoid touching the patch. Wash your hands with soap and water after contact with the patch.

## 2020-08-22 NOTE — Transfer of Care (Signed)
Immediate Anesthesia Transfer of Care Note  Patient: Caitlin Hoffman  Procedure(s) Performed: TRANSVAGINAL TAPE (TVT) PROCEDURE (N/A Vagina ) CYSTOSCOPY (N/A Bladder)  Patient Location: PACU  Anesthesia Type:General  Level of Consciousness: awake, alert  and oriented  Airway & Oxygen Therapy: Patient Spontanous Breathing  Post-op Assessment: Report given to RN and Post -op Vital signs reviewed and stable  Post vital signs: Reviewed and stable  Last Vitals:  Vitals Value Taken Time  BP 143/76 08/22/20 1350  Temp    Pulse 88 08/22/20 1353  Resp 14 08/22/20 1353  SpO2 98 % 08/22/20 1353  Vitals shown include unvalidated device data.  Last Pain:  Vitals:   08/22/20 1135  TempSrc: Oral  PainSc: 0-No pain      Patients Stated Pain Goal: 3 (08/22/20 1135)  Complications: No complications documented.

## 2020-08-22 NOTE — H&P (Signed)
Sukhman Digilio is an 45 y.o. female with worsening urinary stress incontinence.  She leaks when she coughs, sneezes, laughs and will leak with intercourse. She denies urgency or over active bladder symptoms.    Pertinent Gynecological History: Menses: regular every month without intermenstrual spotting Bleeding: normal Contraception: none DES exposure: denies Blood transfusions: none Sexually transmitted diseases: no past history Previous GYN Procedures: none  Last mammogram: normal Date: 05/2020 Last pap: normal Date: 05/23/20 OB History: G1, P1   Menstrual History: Menarche age: 55 No LMP recorded (lmp unknown).    Past Medical History:  Diagnosis Date  . Anxiety   . Depression   . Genital herpes   . GERD (gastroesophageal reflux disease)   . History of hyperthyroidism    previously seen by endocrinologist, dr Everardo All (lov in epic 02-23-2009) stated very mild then resolved after normal labs  . Hyperthyroidism   . Osteopenia   . SUI (stress urinary incontinence, female)   . Wears contact lenses   . Wears contact lenses     Past Surgical History:  Procedure Laterality Date  . NO PAST SURGERIES      Family History  Problem Relation Age of Onset  . Gastric cancer Mother        Stomach cancer?    Social History:  reports that she quit smoking about 10 years ago. Her smoking use included cigarettes. She quit after 5.00 years of use. She has never used smokeless tobacco. She reports current alcohol use. She reports that she does not use drugs.  Allergies: No Known Allergies  Medications Prior to Admission  Medication Sig Dispense Refill Last Dose  . GARLIC PO Take by mouth daily.   Past Week at Unknown time  . Multiple Vitamins-Minerals (WOMENS MULTIVITAMIN PO) Take by mouth daily.       Review of Systems as per HPI  Height 5\' 4"  (1.626 m), weight 65.8 kg. Physical Exam In office: urethral hypermobility.  Mild cystocele Grade 2  Results for orders placed or  performed during the hospital encounter of 08/22/20 (from the past 24 hour(s))  Pregnancy, urine POC     Status: None   Collection Time: 08/22/20 11:22 AM  Result Value Ref Range   Preg Test, Ur NEGATIVE NEGATIVE    No results found.  Assessment/Plan: 45 year old with urinary stress incontinence.  Options were previously discussed with patient to include no intervention, behavioral modification to avoid full bladder in conjunction with physical therapy of pelvic floor, pessary, mid-urethral sling placement, or Burch procedure.  Pt desires anti-incontinence mid-urethral sling procedure. She is scheduled at [facility].  Risks of the procedure discussed including but not limited to infection/bleeding, persistent SUI, de novo urge incontinence, urinary retention requiring postoperative self-catheterization/foley placement and even return to OR to release sling, injury to bladder or urethra necessitating further surgery, and mesh erosion. Pt advised that long-term success rates (after 5 years) are about 85%. Pt understands the risks and desires mid-urethral sling placement.  Vicktoria Muckey 08/22/2020, 11:26 AM

## 2020-08-22 NOTE — Anesthesia Postprocedure Evaluation (Signed)
Anesthesia Post Note  Patient: Caitlin Hoffman  Procedure(s) Performed: TRANSVAGINAL TAPE (TVT) PROCEDURE (N/A Vagina ) CYSTOSCOPY (N/A Bladder)     Patient location during evaluation: PACU Anesthesia Type: General Level of consciousness: awake and alert Pain management: pain level controlled Vital Signs Assessment: post-procedure vital signs reviewed and stable Respiratory status: spontaneous breathing, nonlabored ventilation and respiratory function stable Cardiovascular status: blood pressure returned to baseline and stable Postop Assessment: no apparent nausea or vomiting Anesthetic complications: no   No complications documented.  Last Vitals:  Vitals:   08/22/20 1415 08/22/20 1431  BP: 120/76 115/76  Pulse: 76 71  Resp: 13 11  Temp:    SpO2: 100% 100%    Last Pain:  Vitals:   08/22/20 1431  TempSrc:   PainSc: 0-No pain                 Lucretia Kern

## 2020-08-23 ENCOUNTER — Encounter (HOSPITAL_BASED_OUTPATIENT_CLINIC_OR_DEPARTMENT_OTHER): Payer: Self-pay | Admitting: Obstetrics & Gynecology

## 2020-08-25 ENCOUNTER — Emergency Department (HOSPITAL_COMMUNITY): Payer: Medicaid Other

## 2020-08-25 ENCOUNTER — Encounter (HOSPITAL_COMMUNITY): Payer: Self-pay

## 2020-08-25 ENCOUNTER — Other Ambulatory Visit: Payer: Self-pay

## 2020-08-25 ENCOUNTER — Emergency Department (HOSPITAL_COMMUNITY)
Admission: EM | Admit: 2020-08-25 | Discharge: 2020-08-25 | Disposition: A | Discharge status: Home / Self Care | Payer: Medicaid Other | Attending: Emergency Medicine | Admitting: Emergency Medicine

## 2020-08-25 DIAGNOSIS — R102 Pelvic and perineal pain: Secondary | ICD-10-CM | POA: Insufficient documentation

## 2020-08-25 DIAGNOSIS — R3981 Functional urinary incontinence: Secondary | ICD-10-CM | POA: Insufficient documentation

## 2020-08-25 DIAGNOSIS — R1030 Lower abdominal pain, unspecified: Secondary | ICD-10-CM | POA: Insufficient documentation

## 2020-08-25 DIAGNOSIS — N139 Obstructive and reflux uropathy, unspecified: Secondary | ICD-10-CM

## 2020-08-25 DIAGNOSIS — G8918 Other acute postprocedural pain: Secondary | ICD-10-CM | POA: Diagnosis not present

## 2020-08-25 DIAGNOSIS — N1330 Unspecified hydronephrosis: Secondary | ICD-10-CM | POA: Diagnosis not present

## 2020-08-25 DIAGNOSIS — Z87891 Personal history of nicotine dependence: Secondary | ICD-10-CM | POA: Diagnosis not present

## 2020-08-25 LAB — CBC WITH DIFFERENTIAL/PLATELET
Abs Immature Granulocytes: 0.06 10*3/uL (ref 0.00–0.07)
Basophils Absolute: 0.1 10*3/uL (ref 0.0–0.1)
Basophils Relative: 1 %
Eosinophils Absolute: 0.3 10*3/uL (ref 0.0–0.5)
Eosinophils Relative: 2 %
HCT: 42.8 % (ref 36.0–46.0)
Hemoglobin: 13.9 g/dL (ref 12.0–15.0)
Immature Granulocytes: 0 %
Lymphocytes Relative: 31 %
Lymphs Abs: 4.4 10*3/uL — ABNORMAL HIGH (ref 0.7–4.0)
MCH: 29.4 pg (ref 26.0–34.0)
MCHC: 32.5 g/dL (ref 30.0–36.0)
MCV: 90.5 fL (ref 80.0–100.0)
Monocytes Absolute: 1 10*3/uL (ref 0.1–1.0)
Monocytes Relative: 7 %
Neutro Abs: 8.4 10*3/uL — ABNORMAL HIGH (ref 1.7–7.7)
Neutrophils Relative %: 59 %
Platelets: 327 10*3/uL (ref 150–400)
RBC: 4.73 MIL/uL (ref 3.87–5.11)
RDW: 13.1 % (ref 11.5–15.5)
WBC: 14.3 10*3/uL — ABNORMAL HIGH (ref 4.0–10.5)
nRBC: 0 % (ref 0.0–0.2)

## 2020-08-25 LAB — I-STAT BETA HCG BLOOD, ED (MC, WL, AP ONLY): I-stat hCG, quantitative: 12.6 m[IU]/mL — ABNORMAL HIGH (ref <5)

## 2020-08-25 LAB — URINALYSIS, ROUTINE W REFLEX MICROSCOPIC
Bilirubin Urine: NEGATIVE
Glucose, UA: NEGATIVE mg/dL
Ketones, ur: NEGATIVE mg/dL
Leukocytes,Ua: NEGATIVE
Nitrite: NEGATIVE
Protein, ur: NEGATIVE mg/dL
Specific Gravity, Urine: 1.01 (ref 1.005–1.030)
pH: 6 (ref 5.0–8.0)

## 2020-08-25 LAB — HCG, SERUM, QUALITATIVE: Preg, Serum: NEGATIVE

## 2020-08-25 LAB — BASIC METABOLIC PANEL
Anion gap: 11 (ref 5–15)
BUN: 19 mg/dL (ref 6–20)
CO2: 24 mmol/L (ref 22–32)
Calcium: 9.5 mg/dL (ref 8.9–10.3)
Chloride: 99 mmol/L (ref 98–111)
Creatinine, Ser: 0.75 mg/dL (ref 0.44–1.00)
GFR, Estimated: >60 mL/min (ref >60)
Glucose, Bld: 108 mg/dL — ABNORMAL HIGH (ref 70–99)
Potassium: 3.7 mmol/L (ref 3.5–5.1)
Sodium: 134 mmol/L — ABNORMAL LOW (ref 135–145)

## 2020-08-25 MED ORDER — SODIUM CHLORIDE 0.9 % IV BOLUS
1000.0000 mL | Freq: Once | INTRAVENOUS | Status: AC
  Administered 2020-08-25: 1000 mL via INTRAVENOUS

## 2020-08-25 MED ORDER — MORPHINE SULFATE (PF) 4 MG/ML IV SOLN
4.0000 mg | Freq: Once | INTRAVENOUS | Status: AC
  Administered 2020-08-25: 4 mg via INTRAVENOUS
  Filled 2020-08-25: qty 1

## 2020-08-25 MED ORDER — PHENAZOPYRIDINE HCL 200 MG PO TABS: 200.0000 mg | ORAL_TABLET | Freq: Three times a day (TID) | ORAL | 0 refills | Status: DC

## 2020-08-25 MED ORDER — IOHEXOL 300 MG/ML  SOLN
100.0000 mL | Freq: Once | INTRAMUSCULAR | Status: AC | PRN
  Administered 2020-08-25: 100 mL via INTRAVENOUS

## 2020-08-25 MED ORDER — LORAZEPAM 2 MG/ML IJ SOLN
1.0000 mg | Freq: Once | INTRAMUSCULAR | Status: AC
  Administered 2020-08-25: 1 mg via INTRAVENOUS
  Filled 2020-08-25: qty 1

## 2020-08-25 MED ORDER — SULFAMETHOXAZOLE-TRIMETHOPRIM 800-160 MG PO TABS: 1.0000 | ORAL_TABLET | Freq: Two times a day (BID) | ORAL | 0 refills | Status: AC

## 2020-08-25 MED ORDER — HYDROMORPHONE HCL 1 MG/ML IJ SOLN
1.0000 mg | Freq: Once | INTRAMUSCULAR | Status: AC
  Administered 2020-08-25: 1 mg via INTRAVENOUS
  Filled 2020-08-25: qty 1

## 2020-08-25 MED ORDER — HYDROMORPHONE HCL 1 MG/ML IJ SOLN
1.0000 mg | Freq: Once | INTRAMUSCULAR | Status: DC
Start: 2020-08-25 — End: 2020-08-25
  Filled 2020-08-25: qty 1

## 2020-08-25 MED ORDER — ONDANSETRON HCL 4 MG/2ML IJ SOLN
4.0000 mg | Freq: Once | INTRAMUSCULAR | Status: AC
  Administered 2020-08-25: 4 mg via INTRAVENOUS
  Filled 2020-08-25: qty 2

## 2020-08-25 MED ORDER — SODIUM CHLORIDE 0.9 % IV SOLN
12.5000 mg | Freq: Once | INTRAVENOUS | Status: DC
  Filled 2020-08-25: qty 0.5

## 2020-08-25 NOTE — ED Notes (Signed)
Pt states pain/nausea unchanged, provider made aware.

## 2020-08-25 NOTE — ED Notes (Signed)
Pt still yelling in pain, pacing in room, states no relief from pain medication. Unable to tolerate sitting/lying. Provider made aware. New orders to follow.

## 2020-08-25 NOTE — ED Triage Notes (Signed)
Pt reports possible post operative complication. Was admitted for stress incontinence and had a sling procedure on 08/22/20. Pt presents in severe pain.

## 2020-08-25 NOTE — ED Notes (Signed)
Pt refused pain medication at this time. States its "not pain" it feels more like "UTI pain", cont endorsing nausea. Asking to speak with dr. Provider aware.

## 2020-08-25 NOTE — ED Notes (Signed)
Pt refusing nausea meds. States she wants to wait at this time. Pt cont being unable to sit/lay in stretcher. Pacing in room. Yelling out intermittently.

## 2020-08-25 NOTE — ED Provider Notes (Signed)
Patient seen after prior ED provider.  CT imaging revealed significantly enlarged bladder consistent with bladder outlet obstruction.  Patient improved after placement of Foley.  Dr. Sallye Ober - covering for Dr. Mora Appl -aware of case.  Patient to be discharged after bladder drainage.  Patient will follow up with GYN on Tuesday for voiding trail and/or TVT adjustment.  Patient and patient's husband at bedside understand plan of care.  Importance of close follow-up is stressed.  Strict return precautions given and understood.    Wynetta Fines, MD 08/25/20 [phone]

## 2020-08-25 NOTE — ED Notes (Signed)
Pt unable to tolerate sitting on stretcher or changing at this time. Pt states more comfortably when standing/swaying or kneeling.

## 2020-08-25 NOTE — Discharge Instructions (Addendum)
Follow-up with Dr. Mora Appl.    You have an appointment for Tuesday morning at 915.

## 2020-08-25 NOTE — ED Notes (Signed)
Pt states she is not able to complete CT at this time.

## 2020-08-25 NOTE — ED Notes (Signed)
MD made aware of bladder scan.

## 2020-08-25 NOTE — ED Notes (Signed)
Pt discharged from this ED in stable condition at this time. All discharge instructions and follow up care reviewed with pt with no further questions at this time. Pt ambulatory with assitance, clear speech.

## 2020-08-25 NOTE — ED Notes (Signed)
Pt cont yelling out, unable to tolerate laying/sitting in bed. States she needs to constantly urinate. Commode still at bedside.

## 2020-08-25 NOTE — ED Provider Notes (Signed)
[name]-EMERGENCY DEPT Provider Note   CSN: [id number] Arrival date & time: 08/25/20  0430     History Chief Complaint  Patient presents with   Post-op Problem    Caitlin Hoffman is a 45 y.o. female.  Patient is a 45 year old female with history of GERD, hypothyroidism, depression, and stress urinary incontinence.  Patient underwent bladder sling procedure 3 days ago by Dr. Mora Appl from gynecology.  Patient was doing well until this evening when she developed severe pain in the area of her surgery.  She describes severe cramping pain to the area in and about her vagina.  Pain is crampy and comes in waves.  She reports some urinary incontinence since the pain began.  She denies any fevers or chills.  The history is provided by the patient.       Past Medical History:  Diagnosis Date   Anxiety    Depression    Genital herpes    GERD (gastroesophageal reflux disease)    History of hyperthyroidism    previously seen by endocrinologist, dr Everardo All (lov in epic 02-23-2009) stated very mild then resolved after normal labs   Hyperthyroidism    Osteopenia    SUI (stress urinary incontinence, female)    Wears contact lenses    Wears contact lenses     Patient Active Problem List   Diagnosis Date Noted   GENITAL HERPES 02/23/2009   GOITER, MULTINODULAR 02/23/2009   HYPERTHYROIDISM 02/23/2009   GERD 02/23/2009    Past Surgical History:  Procedure Laterality Date   BLADDER SUSPENSION N/A 08/22/2020   Procedure: TRANSVAGINAL TAPE (TVT) PROCEDURE;  Surgeon: Essie Hart, MD;  Location: [facility];  Service: Gynecology;  Laterality: N/A;   CYSTOSCOPY N/A 08/22/2020   Procedure: CYSTOSCOPY;  Surgeon: Essie Hart, MD;  Location: [facility];  Service: Gynecology;  Laterality: N/A;   NO PAST SURGERIES       OB History   No obstetric history on file.     Family History  Problem Relation Age of Onset   Gastric  cancer Mother        Stomach cancer?    Social History   Tobacco Use   Smoking status: Former Smoker    Years: 5.00    Types: Cigarettes    Quit date: 08/19/2010    Years since quitting: 10.0   Smokeless tobacco: Never Used   Tobacco comment: socially  Vaping Use   Vaping Use: Never used  Substance Use Topics   Alcohol use: Yes    Alcohol/week: 0.0 standard drinks    Comment: occasional   Drug use: Never    Home Medications Prior to Admission medications   Medication Sig Start Date End Date Taking? Authorizing Provider  GARLIC PO Take by mouth daily.    [provider]  ibuprofen (ADVIL) 600 MG tablet take 1 tablet po pc every 6 hours for 5 days then prn-post operative pain 08/22/20   Henreitta Leber, PA-C  Multiple Vitamins-Minerals (WOMENS MULTIVITAMIN PO) Take by mouth daily.    [provider]  oxyCODONE-acetaminophen (PERCOCET/ROXICET) 5-325 MG tablet take 1 tablet po every 6 hours as needed for breakthrough post operative pain 08/22/20   Henreitta Leber, PA-C    Allergies    Patient has no known allergies.  Review of Systems   Review of Systems  All other systems reviewed and are negative.   Physical Exam Updated Vital Signs BP (!) 148/93 (BP Location: Right Arm)  Pulse 73    Temp 98.9 F (37.2 C) (Oral)    Resp 16    SpO2 100%   Physical Exam Vitals and nursing note reviewed.  Constitutional:      General: She is not in acute distress.    Appearance: She is well-developed. She is not diaphoretic.  HENT:     Head: Normocephalic and atraumatic.  Cardiovascular:     Rate and Rhythm: Normal rate and regular rhythm.     Heart sounds: No murmur heard. No friction rub. No gallop.   Pulmonary:     Effort: Pulmonary effort is normal. No respiratory distress.     Breath sounds: Normal breath sounds. No wheezing.  Abdominal:     General: Bowel sounds are normal. There is no distension.     Palpations: Abdomen is soft.     Tenderness:  There is no abdominal tenderness.  Musculoskeletal:        General: Normal range of motion.     Cervical back: Normal range of motion and neck supple.  Skin:    General: Skin is warm and dry.  Neurological:     Mental Status: She is alert and oriented to person, place, and time.     ED Results / Procedures / Treatments   Labs (all labs ordered are listed, but only abnormal results are displayed) Labs Reviewed  BASIC METABOLIC PANEL  CBC WITH DIFFERENTIAL/PLATELET  URINALYSIS, ROUTINE W REFLEX MICROSCOPIC  I-STAT BETA HCG BLOOD, ED ([facility], [facility], [facility] ONLY)    EKG None  Radiology No results found.  Procedures Procedures   Medications Ordered in ED Medications  sodium chloride 0.9 % bolus 1,000 mL (has no administration in time range)  morphine 4 MG/ML injection 4 mg (has no administration in time range)  ondansetron (ZOFRAN) injection 4 mg (has no administration in time range)    ED Course  I have reviewed the triage vital signs and the nursing notes.  Pertinent labs & imaging results that were available during my care of the patient were reviewed by me and considered in my medical decision making (see chart for details).    MDM Rules/Calculators/A&P  Patient presenting with complaints of severe suprapubic pain.  She is 3 days status post bladder sling procedure for urinary incontinence.  Patient arrives here in severe pain and unable to lie flat.  Patient given multiple doses of pain medications, however continues to be severely uncomfortable.  She does have a slight leukocytosis, but laboratory studies are otherwise unremarkable.  Patient is to undergo a CT scan to further evaluate for the cause of her pain.  Care will be signed out to Dr. Rodena Medin at shift change.  He will obtain the results of the CT scan and determine the final disposition.  Final Clinical Impression(s) / ED Diagnoses Final diagnoses:  None    Rx / DC Orders ED Discharge Orders    None        Geoffery Lyons, MD 08/26/20 [phone]

## 2020-08-25 NOTE — ED Notes (Signed)
Pt states a lot of relief after foley placement.

## 2020-08-25 NOTE — ED Notes (Signed)
Pt states she is still unable to complete CT at this time. States she wants to know what is wrong with her.

## 2020-08-25 NOTE — ED Notes (Signed)
Pt states she is now ready for CT. CT notified.

## 2022-01-16 ENCOUNTER — Encounter: Payer: Self-pay | Admitting: Gastroenterology

## 2022-03-13 ENCOUNTER — Ambulatory Visit: Payer: Medicaid Other | Admitting: Gastroenterology

## 2022-08-04 ENCOUNTER — Ambulatory Visit
Admission: EM | Admit: 2022-08-04 | Discharge: 2022-08-04 | Disposition: A | Discharge status: Home / Self Care | Payer: Medicaid Other | Attending: Internal Medicine | Admitting: Internal Medicine

## 2022-08-04 DIAGNOSIS — R519 Headache, unspecified: Secondary | ICD-10-CM | POA: Diagnosis present

## 2022-08-04 DIAGNOSIS — U071 COVID-19: Secondary | ICD-10-CM | POA: Diagnosis not present

## 2022-08-04 DIAGNOSIS — J069 Acute upper respiratory infection, unspecified: Secondary | ICD-10-CM | POA: Diagnosis present

## 2022-08-04 MED ORDER — KETOROLAC TROMETHAMINE 30 MG/ML IJ SOLN
30.0000 mg | Freq: Once | INTRAMUSCULAR | Status: AC
  Administered 2022-08-04: 30 mg via INTRAMUSCULAR

## 2022-08-04 MED ORDER — DEXAMETHASONE SODIUM PHOSPHATE 10 MG/ML IJ SOLN
10.0000 mg | Freq: Once | INTRAMUSCULAR | Status: AC
  Administered 2022-08-04: 10 mg via INTRAMUSCULAR

## 2022-08-04 NOTE — ED Provider Notes (Addendum)
[facility]    CSN: [id number] Arrival date & time: 08/04/22  0818      History   Chief Complaint Chief Complaint  Patient presents with   Facial Pain    HPI Caitlin Hoffman is a 47 y.o. female.   Patient presents with nasal congestion, sinus pressure, headache that started 2 days ago.  Patient denies any associated cough or fever.  Patient reports history of recurrent sinus infections.  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  Denies chest pain, shortness of breath.  Has taken Tylenol for symptoms. Patient reports that she typically gets antibiotic and steroid injection for these symptoms.      Past Medical History:  Diagnosis Date   Anxiety    Depression    Genital herpes    GERD (gastroesophageal reflux disease)    History of hyperthyroidism    previously seen by endocrinologist, dr Loanne Drilling (lov in epic 02-23-2009) stated very mild then resolved after normal labs   Hyperthyroidism    Osteopenia    SUI (stress urinary incontinence, female)    Wears contact lenses    Wears contact lenses     Patient Active Problem List   Diagnosis Date Noted   GENITAL HERPES 02/23/2009   GOITER, MULTINODULAR 02/23/2009   HYPERTHYROIDISM 02/23/2009   GERD 02/23/2009    Past Surgical History:  Procedure Laterality Date   BLADDER SUSPENSION N/A 08/22/2020   Procedure: TRANSVAGINAL TAPE (TVT) PROCEDURE;  Surgeon: Sanjuana Kava, MD;  Location: [facility];  Service: Gynecology;  Laterality: N/A;   CYSTOSCOPY N/A 08/22/2020   Procedure: CYSTOSCOPY;  Surgeon: Sanjuana Kava, MD;  Location: [facility];  Service: Gynecology;  Laterality: N/A;   NO PAST SURGERIES      OB History   No obstetric history on file.      Home Medications    Prior to Admission medications   Medication Sig Start Date End Date Taking? Authorizing Provider  Marilu Favre 150-35 MCG/24HR transdermal patch Apply 1 patch every week by  transdermal route. 11/24/21  Yes [provider]  GARLIC PO Take by mouth daily.    [provider]  ibuprofen (ADVIL) 600 MG tablet take 1 tablet po pc every 6 hours for 5 days then prn-post operative pain 08/22/20   Earnstine Regal, PA-C  Multiple Vitamins-Minerals (WOMENS MULTIVITAMIN PO) Take by mouth daily.    [provider]  oxyCODONE-acetaminophen (PERCOCET/ROXICET) 5-325 MG tablet take 1 tablet po every 6 hours as needed for breakthrough post operative pain 08/22/20   Earnstine Regal, PA-C  phenazopyridine (PYRIDIUM) 200 MG tablet Take 1 tablet (200 mg total) by mouth 3 (three) times daily. 08/25/20   Valarie Merino, MD    Family History Family History  Problem Relation Age of Onset   Gastric cancer Mother        Stomach cancer?    Social History Social History   Tobacco Use   Smoking status: Former    Years: 5.00    Types: Cigarettes    Quit date: 08/19/2010    Years since quitting: 11.9   Smokeless tobacco: Never   Tobacco comments:    socially  Vaping Use   Vaping Use: Never used  Substance Use Topics   Alcohol use: Yes    Alcohol/week: 0.0 standard drinks of alcohol    Comment: occasional   Drug use: Never     Allergies   Patient has no known allergies.  Review of Systems Review of Systems Per HPI  Physical Exam Triage Vital Signs ED Triage Vitals [08/04/22 0857]  Enc Vitals Group     BP 134/81     Pulse Rate 89     Resp 16     Temp 98.5 F (36.9 C)     Temp Source Oral     SpO2 98 %     Weight      Height      Head Circumference      Peak Flow      Pain Score 9     Pain Loc      Pain Edu?      Excl. in [place]?    No data found.  Updated Vital Signs BP 134/81 (BP Location: Left Arm)   Pulse 89   Temp 98.5 F (36.9 C) (Oral)   Resp 16   SpO2 98%   Visual Acuity Right Eye Distance:   Left Eye Distance:   Bilateral Distance:    Right Eye Near:   Left Eye Near:    Bilateral Near:     Physical  Exam Constitutional:      General: She is not in acute distress.    Appearance: Normal appearance. She is not toxic-appearing or diaphoretic.  HENT:     Head: Normocephalic and atraumatic.     Right Ear: Tympanic membrane and ear canal normal.     Left Ear: Tympanic membrane and ear canal normal.     Nose: Congestion present.     Mouth/Throat:     Mouth: Mucous membranes are moist.     Pharynx: No posterior oropharyngeal erythema.  Eyes:     Extraocular Movements: Extraocular movements intact.     Conjunctiva/sclera: Conjunctivae normal.     Pupils: Pupils are equal, round, and reactive to light.  Cardiovascular:     Rate and Rhythm: Normal rate and regular rhythm.     Pulses: Normal pulses.     Heart sounds: Normal heart sounds.  Pulmonary:     Effort: Pulmonary effort is normal. No respiratory distress.     Breath sounds: Normal breath sounds. No stridor. No wheezing, rhonchi or rales.  Abdominal:     General: Abdomen is flat. Bowel sounds are normal.     Palpations: Abdomen is soft.  Musculoskeletal:        General: Normal range of motion.     Cervical back: Normal range of motion.  Skin:    General: Skin is warm and dry.  Neurological:     General: No focal deficit present.     Mental Status: She is alert and oriented to person, place, and time. Mental status is at baseline.  Psychiatric:        Mood and Affect: Mood normal.        Behavior: Behavior normal.      UC Treatments / Results  Labs (all labs ordered are listed, but only abnormal results are displayed) Labs Reviewed - No data to display  EKG   Radiology No results found.  Procedures Procedures (including critical care time)  Medications Ordered in UC Medications  ketorolac (TORADOL) 30 MG/ML injection 30 mg (has no administration in time range)  dexamethasone (DECADRON) injection 10 mg (has no administration in time range)    Initial Impression / Assessment and Plan / UC Course  I have  reviewed the triage vital signs and the nursing notes.  Pertinent labs & imaging results that were available during my care  of the patient were reviewed by me and considered in my medical decision making (see chart for details).     Patient has acute upper respiratory infection.  Most likely viral illness given duration of symptoms.  No indication of bacterial infection on exam at this time.  Will treat with IM steroid and IM Toradol to help alleviate inflammation, sinus pressure, headache. Patient reports negative pregnancy test a few days prior. Advised no NSAIDs for at least 24 hours following injection.  Advised supportive care including saline rinses.  Advised return precautions.  Patient verbalized understanding and was agreeable with plan. Final Clinical Impressions(s) / UC Diagnoses   Final diagnoses:  Acute upper respiratory infection  Acute nonintractable headache, unspecified headache type     Discharge Instructions      I am treating you with 2 different shots today to decrease inflammation and congestion associated with your nasal congestion and headache.  Do not take any ibuprofen, Advil, Aleve for at least 24 hours following injections.  COVID test is pending.  Will call if it is positive.  Please follow-up if any symptoms persist or worsen.    ED Prescriptions   None    PDMP not reviewed this encounter.   Teodora Medici, [place] 08/04/22 [place], [place], [place] 08/04/22 [phone]

## 2022-08-04 NOTE — ED Triage Notes (Signed)
Pt c/o sinus infection onset ~ thurs states this is recurrent. C/o nasal congestion with drainage.

## 2022-08-04 NOTE — Discharge Instructions (Addendum)
I am treating you with 2 different shots today to decrease inflammation and congestion associated with your nasal congestion and headache.  Do not take any ibuprofen, Advil, Aleve for at least 24 hours following injections.  COVID test is pending.  Will call if it is positive.  Please follow-up if any symptoms persist or worsen.

## 2022-08-05 LAB — SARS CORONAVIRUS 2 (TAT 6-24 HRS): SARS Coronavirus 2: POSITIVE — AB

## 2022-12-30 IMAGING — CT CT ABD-PELV W/ CM
3 of 6 series · 16 of 46 positions shown, 18 images · IV contrast (omnipaque)
Comparison: None.

CLINICAL DATA: Recent bladder bladder sling surgery

EXAM:
CT ABDOMEN AND PELVIS WITH CONTRAST
TECHNIQUE: Multidetector CT imaging of the abdomen and pelvis was performed
using the standard protocol following bolus administration of
intravenous contrast.
CONTRAST:  100mL OMNIPAQUE IOHEXOL 300 MG/ML  SOLN

[Series 2: axial st · axial · 0.68mm/px · z∈[+843,+1218]mm · 11 of 91 slices shown, 13 images]
[im 8/91  soft-tissue]
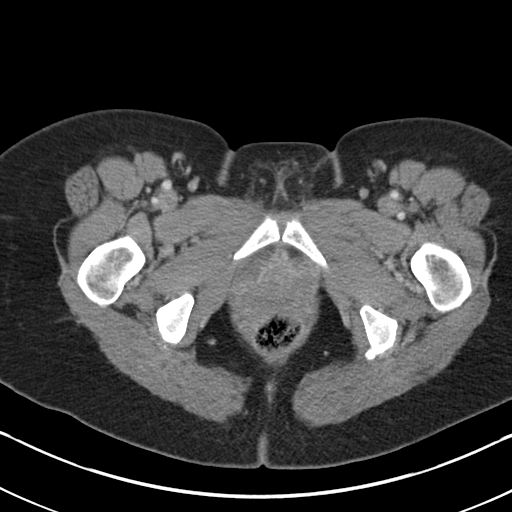
[im 8/91  bone]
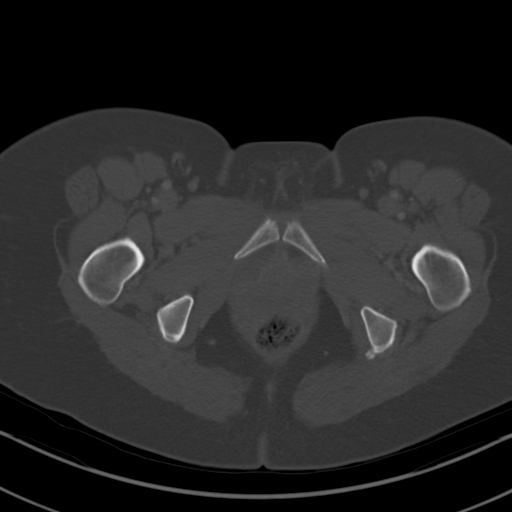
[im 16/91  soft-tissue]
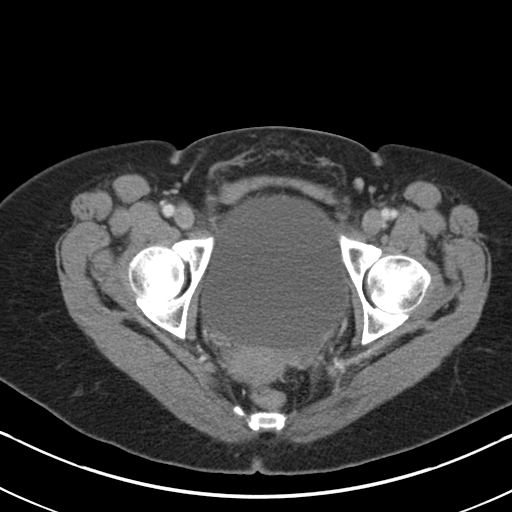
[im 23/91  soft-tissue]
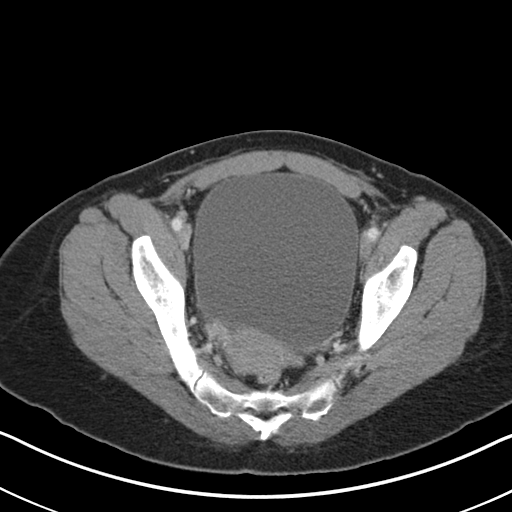
[im 31/91  soft-tissue]
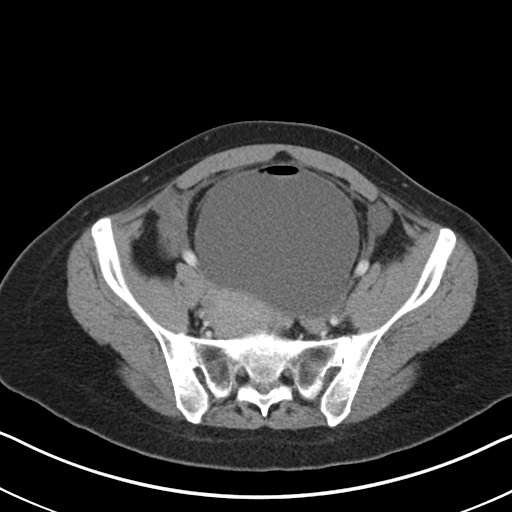
[im 38/91  soft-tissue]
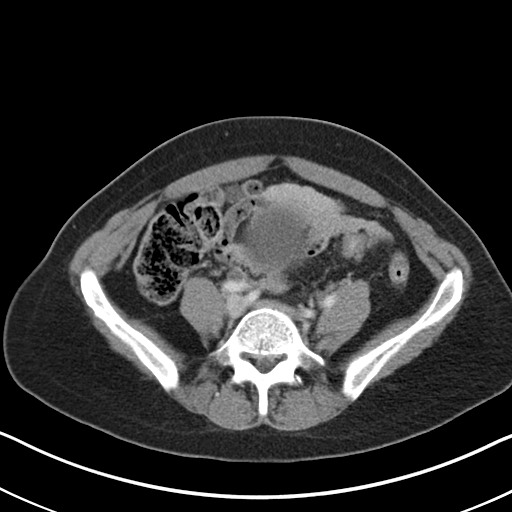
[im 46/91  soft-tissue]
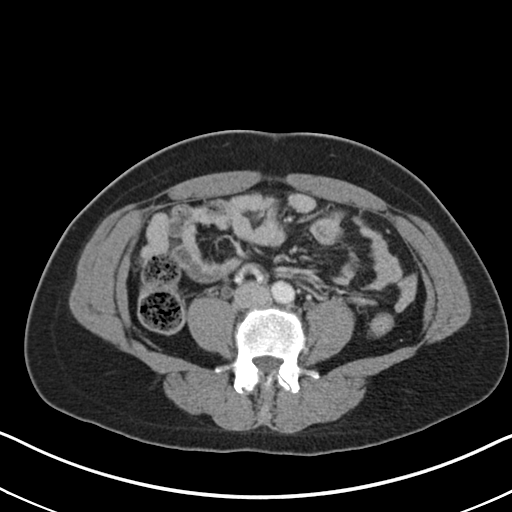
[im 53/91  soft-tissue]
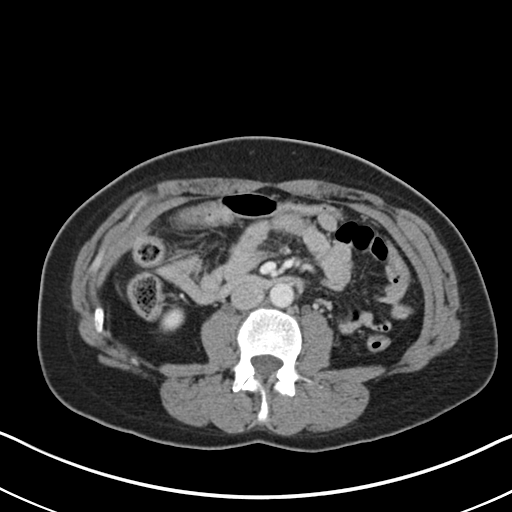
[im 61/91  soft-tissue]
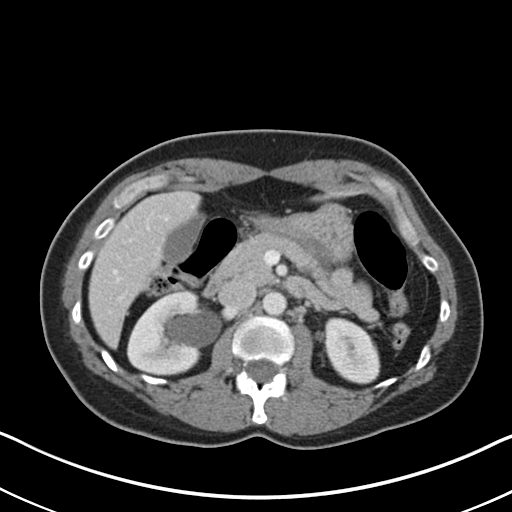
[im 68/91  soft-tissue]
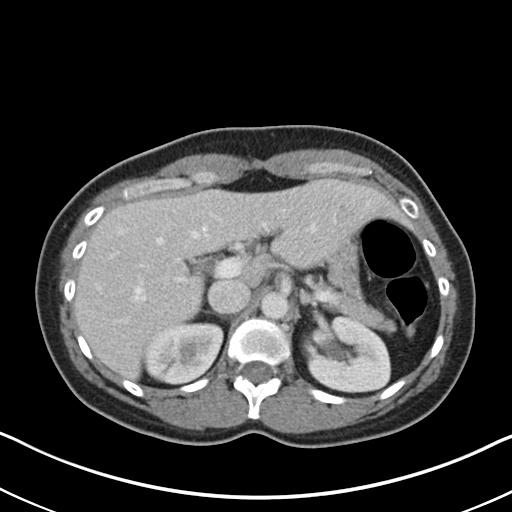
[im 68/91  bone]
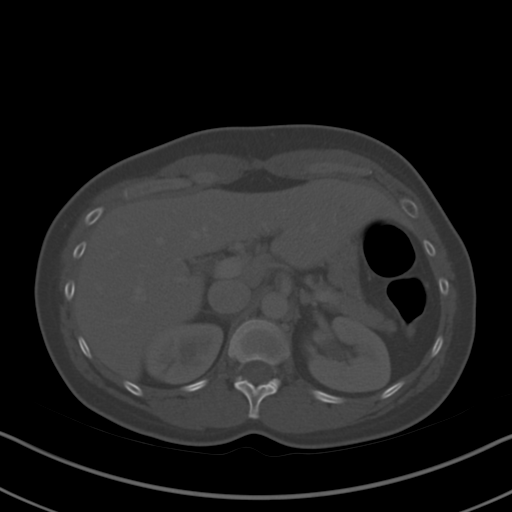
[im 76/91  soft-tissue]
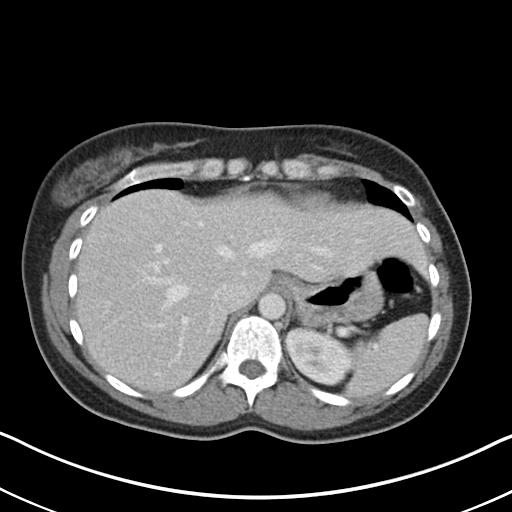
[im 83/91  soft-tissue]
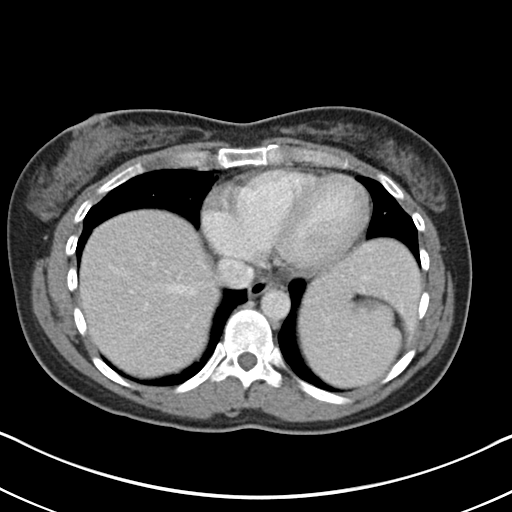

[Series 4: lung bases · axial · 0.55mm/px · z∈[+1170,+1184]mm · 2 of 52 slices shown]
[im 8/52  bone]
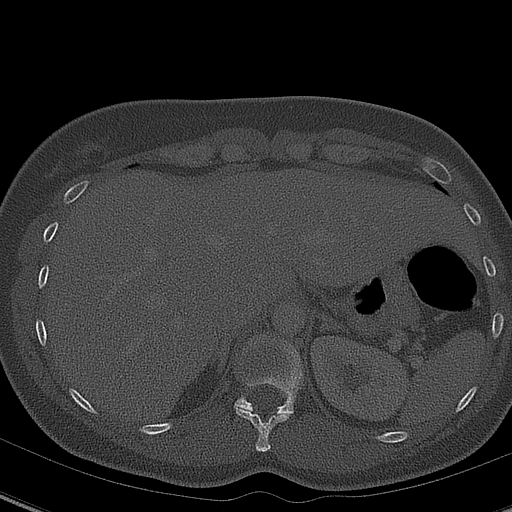
[im 15/52  bone]
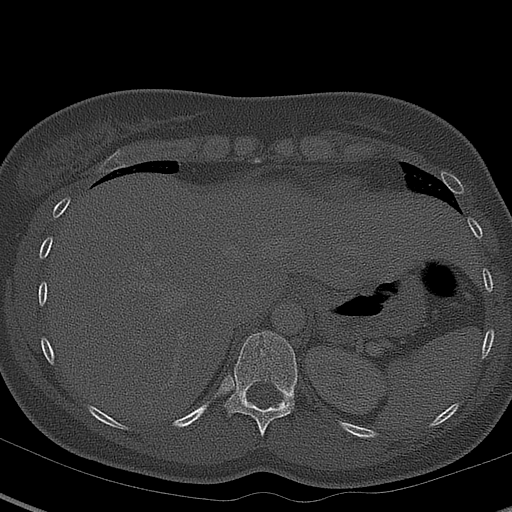

[Series 5: coronal st · coronal · 0.74mm/px · 3 of 141 slices shown]
[im 47/141  soft-tissue]
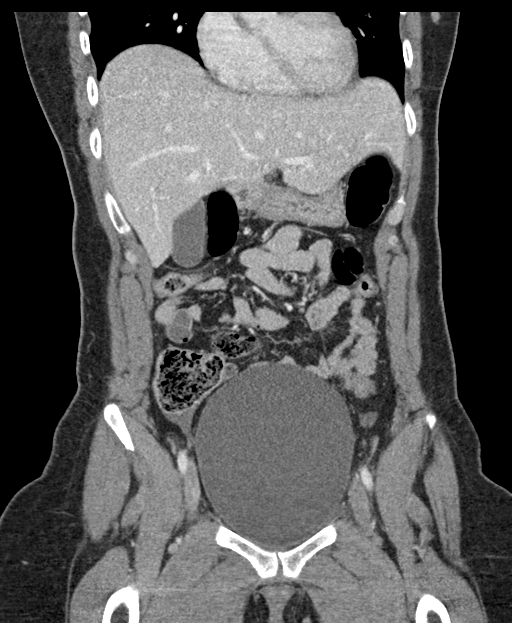
[im 63/141  soft-tissue]
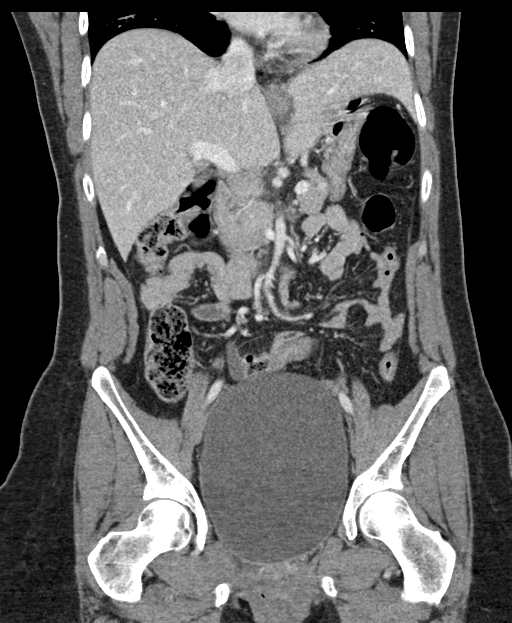
[im 78/141  soft-tissue]
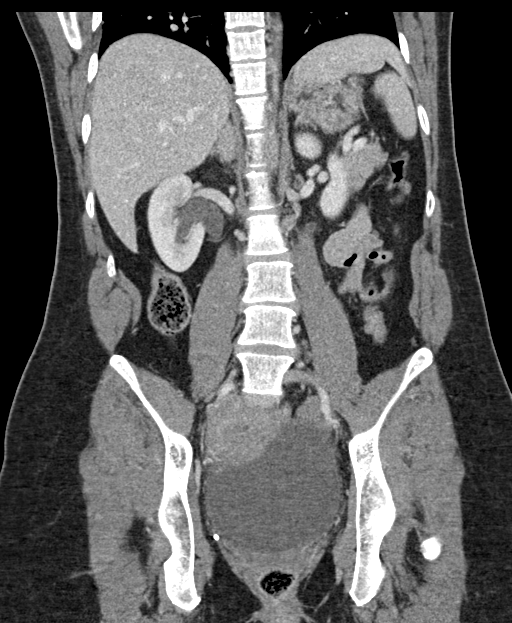

[16 of 46 positions shown; findings below may reference images not displayed]

FINDINGS: Lower chest: Lung bases are clear.

Hepatobiliary: No focal hepatic lesion. No biliary duct dilatation.
Common bile duct is normal.

Pancreas: Pancreas is normal. No ductal dilatation. No pancreatic
inflammation.

Spleen: Normal spleen

Adrenals/urinary tract: Adrenal glands normal. There is bilateral
hydronephrosis and hydroureter. The bladder is markedly distended.
Small amount gas within the bladder. Bladder extends to the level
the umbilicus measuring 15.2 x 9.7 by 11.0 cm (volume = 850 cm^3).
No obstructing lesion identified.

Stomach/Bowel: Stomach, small bowel, appendix, and cecum are normal.
The colon and rectosigmoid colon are normal.

Vascular/Lymphatic: Abdominal aorta is normal caliber. No periportal
or retroperitoneal adenopathy. No pelvic adenopathy.

Reproductive: Uterus and adnexa unremarkable.

Other: No free fluid.

Musculoskeletal: No aggressive osseous lesion.
IMPRESSION: Markedly distended bladder with bilateral hydronephrosis suggest
bladder outlet obstruction.

Small amount of gas within bladder presumed related instrumentation.

## 2023-03-20 ENCOUNTER — Emergency Department (HOSPITAL_BASED_OUTPATIENT_CLINIC_OR_DEPARTMENT_OTHER): Payer: Medicaid Other

## 2023-03-20 ENCOUNTER — Emergency Department (HOSPITAL_BASED_OUTPATIENT_CLINIC_OR_DEPARTMENT_OTHER)
Admission: EM | Admit: 2023-03-20 | Discharge: 2023-03-20 | Disposition: A | Discharge status: Home / Self Care | Payer: Medicaid Other | Attending: Emergency Medicine | Admitting: Emergency Medicine

## 2023-03-20 ENCOUNTER — Other Ambulatory Visit: Payer: Self-pay

## 2023-03-20 ENCOUNTER — Encounter (HOSPITAL_BASED_OUTPATIENT_CLINIC_OR_DEPARTMENT_OTHER): Payer: Self-pay

## 2023-03-20 DIAGNOSIS — K529 Noninfective gastroenteritis and colitis, unspecified: Secondary | ICD-10-CM | POA: Diagnosis not present

## 2023-03-20 DIAGNOSIS — R1084 Generalized abdominal pain: Secondary | ICD-10-CM | POA: Diagnosis present

## 2023-03-20 DIAGNOSIS — D72829 Elevated white blood cell count, unspecified: Secondary | ICD-10-CM | POA: Diagnosis not present

## 2023-03-20 LAB — CBC
HCT: 40.8 % (ref 36.0–46.0)
Hemoglobin: 13.4 g/dL (ref 12.0–15.0)
MCH: 28.8 pg (ref 26.0–34.0)
MCHC: 32.8 g/dL (ref 30.0–36.0)
MCV: 87.6 fL (ref 80.0–100.0)
Platelets: 313 10*3/uL (ref 150–400)
RBC: 4.66 MIL/uL (ref 3.87–5.11)
RDW: 13.5 % (ref 11.5–15.5)
WBC: 19.5 10*3/uL — ABNORMAL HIGH (ref 4.0–10.5)
nRBC: 0 % (ref 0.0–0.2)

## 2023-03-20 LAB — COMPREHENSIVE METABOLIC PANEL
ALT: 16 U/L (ref 0–44)
AST: 16 U/L (ref 15–41)
Albumin: 3.5 g/dL (ref 3.5–5.0)
Alkaline Phosphatase: 64 U/L (ref 38–126)
Anion gap: 10 (ref 5–15)
BUN: 20 mg/dL (ref 6–20)
CO2: 25 mmol/L (ref 22–32)
Calcium: 8.9 mg/dL (ref 8.9–10.3)
Chloride: 103 mmol/L (ref 98–111)
Creatinine, Ser: 0.87 mg/dL (ref 0.44–1.00)
GFR, Estimated: >60 mL/min (ref >60)
Glucose, Bld: 112 mg/dL — ABNORMAL HIGH (ref 70–99)
Potassium: 3.7 mmol/L (ref 3.5–5.1)
Sodium: 138 mmol/L (ref 135–145)
Total Bilirubin: 0.6 mg/dL (ref 0.3–1.2)
Total Protein: 7 g/dL (ref 6.5–8.1)

## 2023-03-20 LAB — URINALYSIS, ROUTINE W REFLEX MICROSCOPIC
Bilirubin Urine: NEGATIVE
Glucose, UA: NEGATIVE mg/dL
Ketones, ur: NEGATIVE mg/dL
Nitrite: NEGATIVE
Protein, ur: 30 mg/dL — AB
Specific Gravity, Urine: 1.01 (ref 1.005–1.030)
pH: 5 (ref 5.0–8.0)

## 2023-03-20 LAB — URINALYSIS, MICROSCOPIC (REFLEX): RBC / HPF: >50 RBC/hpf (ref 0–5)

## 2023-03-20 LAB — LIPASE, BLOOD: Lipase: 34 U/L (ref 11–51)

## 2023-03-20 LAB — HCG, SERUM, QUALITATIVE: Preg, Serum: NEGATIVE

## 2023-03-20 MED ORDER — CIPROFLOXACIN HCL 500 MG PO TABS: 500.0000 mg | ORAL_TABLET | Freq: Two times a day (BID) | ORAL | 0 refills | Status: DC

## 2023-03-20 MED ORDER — FENTANYL CITRATE PF 50 MCG/ML IJ SOSY
50.0000 ug | PREFILLED_SYRINGE | Freq: Once | INTRAMUSCULAR | Status: AC
  Administered 2023-03-20: 50 ug via INTRAVENOUS
  Filled 2023-03-20: qty 1

## 2023-03-20 MED ORDER — SODIUM CHLORIDE 0.9 % IV BOLUS
1000.0000 mL | Freq: Once | INTRAVENOUS | Status: AC
  Administered 2023-03-20: 1000 mL via INTRAVENOUS

## 2023-03-20 MED ORDER — ONDANSETRON 4 MG PO TBDP: 4.0000 mg | ORAL_TABLET | Freq: Three times a day (TID) | ORAL | 0 refills | Status: AC | PRN

## 2023-03-20 MED ORDER — ONDANSETRON HCL 4 MG/2ML IJ SOLN
4.0000 mg | Freq: Once | INTRAMUSCULAR | Status: AC
  Administered 2023-03-20: 4 mg via INTRAVENOUS
  Filled 2023-03-20: qty 2

## 2023-03-20 MED ORDER — IOHEXOL 300 MG/ML  SOLN
100.0000 mL | Freq: Once | INTRAMUSCULAR | Status: AC | PRN
  Administered 2023-03-20: 100 mL via INTRAVENOUS

## 2023-03-20 NOTE — ED Provider Notes (Signed)
[place] [facility]  Provider Note  CSN: [id number] Arrival date & time: 03/20/23 0406  History Chief Complaint  Patient presents with   Abdominal Pain    Caitlin Hoffman is a 47 y.o. female here for evaluation of several hours of diffuse abdominal pain, vomiting and diarrhea. Non bloody. No fever. She has history of IBS and has occasional episodes of similar symptoms but the vomiting and pain have been more severe and persistent with this episode.    Home Medications Prior to Admission medications   Medication Sig Start Date End Date Taking? Authorizing Provider  ciprofloxacin (CIPRO) 500 MG tablet Take 1 tablet (500 mg total) by mouth every 12 (twelve) hours. 03/20/23  Yes Pollyann Savoy, MD  ondansetron (ZOFRAN-ODT) 4 MG disintegrating tablet Take 1 tablet (4 mg total) by mouth every 8 (eight) hours as needed for nausea or vomiting. 03/20/23  Yes Pollyann Savoy, MD  GARLIC PO Take by mouth daily.    [provider]  ibuprofen (ADVIL) 600 MG tablet take 1 tablet po pc every 6 hours for 5 days then prn-post operative pain 08/22/20   Henreitta Leber, PA-C  Multiple Vitamins-Minerals (WOMENS MULTIVITAMIN PO) Take by mouth daily.    [provider]  oxyCODONE-acetaminophen (PERCOCET/ROXICET) 5-325 MG tablet take 1 tablet po every 6 hours as needed for breakthrough post operative pain 08/22/20   Henreitta Leber, PA-C  phenazopyridine (PYRIDIUM) 200 MG tablet Take 1 tablet (200 mg total) by mouth 3 (three) times daily. 08/25/20   Wynetta Fines, MD  Burr Medico 150-35 MCG/24HR transdermal patch Apply 1 patch every week by transdermal route. 11/24/21   [provider]     Allergies    Patient has no known allergies.   Review of Systems   Review of Systems Please see HPI for pertinent positives and negatives  Physical Exam BP 120/71   Pulse 80   Temp 98.4 F (36.9 C) (Oral)   Resp 18   Ht 5\' 4"  (1.626 m)   Wt 66.7 kg    LMP 02/20/2023 (Exact Date)   SpO2 97%   BMI 25.24 kg/m   Physical Exam Vitals and nursing note reviewed.  Constitutional:      Appearance: Normal appearance.  HENT:     Head: Normocephalic and atraumatic.     Nose: Nose normal.     Mouth/Throat:     Mouth: Mucous membranes are moist.  Eyes:     Extraocular Movements: Extraocular movements intact.     Conjunctiva/sclera: Conjunctivae normal.  Cardiovascular:     Rate and Rhythm: Normal rate.  Pulmonary:     Effort: Pulmonary effort is normal.     Breath sounds: Normal breath sounds.  Abdominal:     General: Abdomen is flat.     Palpations: Abdomen is soft.     Tenderness: There is generalized abdominal tenderness. There is no guarding. Negative signs include Murphy's sign and McBurney's sign.  Musculoskeletal:        General: No swelling. Normal range of motion.     Cervical back: Neck supple.  Skin:    General: Skin is warm and dry.  Neurological:     General: No focal deficit present.     Mental Status: She is alert.  Psychiatric:        Mood and Affect: Mood normal.     ED Results / Procedures / Treatments   EKG None  Procedures Procedures  Medications Ordered in the  ED Medications  fentaNYL (SUBLIMAZE) injection 50 mcg (50 mcg Intravenous Given 03/20/23 0430)  sodium chloride 0.9 % bolus 1,000 mL (0 mLs Intravenous Stopped 03/20/23 0551)  ondansetron (ZOFRAN) injection 4 mg (4 mg Intravenous Given 03/20/23 0430)  iohexol (OMNIPAQUE) 300 MG/ML solution 100 mL (100 mLs Intravenous Contrast Given 03/20/23 0511)    Initial Impression and Plan  Patient here with several hours of vomiting, diarrhea and abdominal pain. Some tenderness, but no focal RUQ/RLQ tenderness and no peritoneal signs. Will check labs, send for CT. Pain/nausea meds and IVF for comfort.   ED Course   Clinical Course as of 03/20/23 0610  Wed Mar 20, 2023  0430 CBC with leukocytosis, otherwise normal.  [CS]  0446 HCG is neg.  [CS]  0449  CMP and lipase are normal.  [CS]  0605 UA is contaminated with squamous cells, there is some blood, anticipates menses any day now, but not otherwise having any urinary symptoms.   I personally viewed the images from radiology studies and agree with radiologist interpretation: CT shows a diffuse colitis, given her leukocytosis, suspicious for an infectious cause. She is aware of incidental uterine findings and will follow up with Gyn/PCP when menses is complete.   She is otherwise feeling much better, no further diarrhea or vomiting since arrival. Plan discharge with Rx for Abx, Zofran. Oral hydration at home.  [CS]    Clinical Course User Index [CS] Pollyann Savoy, MD     MDM Rules/Calculators/A&P Medical Decision Making Given presenting complaint, I considered that admission might be necessary. After review of results from ED lab and/or imaging studies, admission to the hospital is not indicated at this time.    Problems Addressed: Colitis: acute illness or injury  Amount and/or Complexity of Data Reviewed Labs: ordered. Decision-making details documented in ED Course. Radiology: ordered and independent interpretation performed. Decision-making details documented in ED Course.  Risk Prescription drug management. Parenteral controlled substances. Decision regarding hospitalization.     Final Clinical Impression(s) / ED Diagnoses Final diagnoses:  Colitis    Rx / DC Orders ED Discharge Orders          Ordered    ondansetron (ZOFRAN-ODT) 4 MG disintegrating tablet  Every 8 hours PRN        03/20/23 0610    ciprofloxacin (CIPRO) 500 MG tablet  Every 12 hours        03/20/23 0610             Pollyann Savoy, MD 03/20/23 [phone]

## 2023-03-20 NOTE — ED Notes (Signed)
Patient transported to CT 

## 2023-03-20 NOTE — ED Notes (Signed)
ED Provider at bedside. 

## 2023-03-20 NOTE — ED Triage Notes (Signed)
Pt reports since 1 am she has been feeling abd pain, constant diarrhea, and headache. Pt reports she has vomited x13 since 0100. Pt reports she has had this happen before and she will take imodium and it gets better; did not improve today with med. Pt reports hx of IBS.

## 2023-04-08 ENCOUNTER — Other Ambulatory Visit: Payer: Self-pay | Admitting: Physician Assistant

## 2023-04-08 DIAGNOSIS — Z1231 Encounter for screening mammogram for malignant neoplasm of breast: Secondary | ICD-10-CM

## 2023-05-17 ENCOUNTER — Ambulatory Visit
Admission: RE | Admit: 2023-05-17 | Discharge: 2023-05-17 | Disposition: A | Discharge status: Home / Self Care | Payer: Medicaid Other | Source: Ambulatory Visit | Attending: Physician Assistant | Admitting: Physician Assistant

## 2023-05-17 DIAGNOSIS — Z1231 Encounter for screening mammogram for malignant neoplasm of breast: Secondary | ICD-10-CM

## 2023-11-07 ENCOUNTER — Other Ambulatory Visit: Payer: Self-pay

## 2023-11-07 ENCOUNTER — Emergency Department (HOSPITAL_BASED_OUTPATIENT_CLINIC_OR_DEPARTMENT_OTHER)

## 2023-11-07 ENCOUNTER — Encounter (HOSPITAL_BASED_OUTPATIENT_CLINIC_OR_DEPARTMENT_OTHER): Payer: Self-pay

## 2023-11-07 ENCOUNTER — Inpatient Hospital Stay (HOSPITAL_BASED_OUTPATIENT_CLINIC_OR_DEPARTMENT_OTHER)
Admission: EM | Admit: 2023-11-07 | Discharge: 2023-11-09 | DRG: 872 | Disposition: A | Discharge status: Home / Self Care | Attending: Internal Medicine | Admitting: Internal Medicine

## 2023-11-07 DIAGNOSIS — Z79899 Other long term (current) drug therapy: Secondary | ICD-10-CM | POA: Diagnosis not present

## 2023-11-07 DIAGNOSIS — Z1152 Encounter for screening for COVID-19: Secondary | ICD-10-CM | POA: Diagnosis not present

## 2023-11-07 DIAGNOSIS — A419 Sepsis, unspecified organism: Secondary | ICD-10-CM | POA: Diagnosis present

## 2023-11-07 DIAGNOSIS — Z87891 Personal history of nicotine dependence: Secondary | ICD-10-CM | POA: Diagnosis not present

## 2023-11-07 DIAGNOSIS — E0520 Thyrotoxicosis with toxic multinodular goiter without thyrotoxic crisis or storm: Secondary | ICD-10-CM | POA: Diagnosis present

## 2023-11-07 DIAGNOSIS — R5381 Other malaise: Secondary | ICD-10-CM | POA: Diagnosis present

## 2023-11-07 DIAGNOSIS — E039 Hypothyroidism, unspecified: Secondary | ICD-10-CM | POA: Diagnosis present

## 2023-11-07 DIAGNOSIS — F419 Anxiety disorder, unspecified: Secondary | ICD-10-CM | POA: Diagnosis present

## 2023-11-07 DIAGNOSIS — N3946 Mixed incontinence: Secondary | ICD-10-CM | POA: Diagnosis present

## 2023-11-07 DIAGNOSIS — Z800 Family history of malignant neoplasm of digestive organs: Secondary | ICD-10-CM

## 2023-11-07 DIAGNOSIS — N12 Tubulo-interstitial nephritis, not specified as acute or chronic: Secondary | ICD-10-CM | POA: Diagnosis present

## 2023-11-07 DIAGNOSIS — K219 Gastro-esophageal reflux disease without esophagitis: Secondary | ICD-10-CM | POA: Diagnosis present

## 2023-11-07 DIAGNOSIS — F32A Depression, unspecified: Secondary | ICD-10-CM | POA: Diagnosis present

## 2023-11-07 DIAGNOSIS — R011 Cardiac murmur, unspecified: Secondary | ICD-10-CM | POA: Diagnosis present

## 2023-11-07 DIAGNOSIS — R9431 Abnormal electrocardiogram [ECG] [EKG]: Secondary | ICD-10-CM | POA: Diagnosis not present

## 2023-11-07 LAB — URINALYSIS, W/ REFLEX TO CULTURE (INFECTION SUSPECTED)
Bilirubin Urine: NEGATIVE
Glucose, UA: NEGATIVE mg/dL
Ketones, ur: NEGATIVE mg/dL
Nitrite: NEGATIVE
Protein, ur: 30 mg/dL — AB
Specific Gravity, Urine: 1.01 (ref 1.005–1.030)
Squamous Epithelial / HPF: >50 /HPF (ref 0–5)
pH: 6 (ref 5.0–8.0)

## 2023-11-07 LAB — HEPATIC FUNCTION PANEL
ALT: 11 U/L (ref 0–44)
AST: 17 U/L (ref 15–41)
Albumin: 4 g/dL (ref 3.5–5.0)
Alkaline Phosphatase: 119 U/L (ref 38–126)
Bilirubin, Direct: 0.3 mg/dL — ABNORMAL HIGH (ref 0.0–0.2)
Indirect Bilirubin: 0.3 mg/dL (ref 0.3–0.9)
Total Bilirubin: 0.6 mg/dL (ref 0.0–1.2)
Total Protein: 7.7 g/dL (ref 6.5–8.1)

## 2023-11-07 LAB — BASIC METABOLIC PANEL WITH GFR
Anion gap: 15 (ref 5–15)
BUN: 8 mg/dL (ref 6–20)
CO2: 20 mmol/L — ABNORMAL LOW (ref 22–32)
Calcium: 8.9 mg/dL (ref 8.9–10.3)
Chloride: 100 mmol/L (ref 98–111)
Creatinine, Ser: 0.89 mg/dL (ref 0.44–1.00)
GFR, Estimated: >60 mL/min (ref >60)
Glucose, Bld: 156 mg/dL — ABNORMAL HIGH (ref 70–99)
Potassium: 3.8 mmol/L (ref 3.5–5.1)
Sodium: 135 mmol/L (ref 135–145)

## 2023-11-07 LAB — TSH: TSH: 0.038 u[IU]/mL — ABNORMAL LOW (ref 0.350–4.500)

## 2023-11-07 LAB — DIFFERENTIAL
Abs Immature Granulocytes: 0.18 10*3/uL — ABNORMAL HIGH (ref 0.00–0.07)
Basophils Absolute: 0.1 10*3/uL (ref 0.0–0.1)
Basophils Relative: 0 %
Eosinophils Absolute: 0 10*3/uL (ref 0.0–0.5)
Eosinophils Relative: 0 %
Immature Granulocytes: 1 %
Lymphocytes Relative: 10 %
Lymphs Abs: 2.5 10*3/uL (ref 0.7–4.0)
Monocytes Absolute: 2.6 10*3/uL — ABNORMAL HIGH (ref 0.1–1.0)
Monocytes Relative: 10 %
Neutro Abs: 20.1 10*3/uL — ABNORMAL HIGH (ref 1.7–7.7)
Neutrophils Relative %: 79 %

## 2023-11-07 LAB — LACTIC ACID, PLASMA: Lactic Acid, Venous: 1.8 mmol/L (ref 0.5–1.9)

## 2023-11-07 LAB — CBC
HCT: 38.8 % (ref 36.0–46.0)
Hemoglobin: 13 g/dL (ref 12.0–15.0)
MCH: 29 pg (ref 26.0–34.0)
MCHC: 33.5 g/dL (ref 30.0–36.0)
MCV: 86.4 fL (ref 80.0–100.0)
Platelets: 305 10*3/uL (ref 150–400)
RBC: 4.49 MIL/uL (ref 3.87–5.11)
RDW: 13.8 % (ref 11.5–15.5)
WBC: 23.9 10*3/uL — ABNORMAL HIGH (ref 4.0–10.5)
nRBC: 0 % (ref 0.0–0.2)

## 2023-11-07 LAB — PROTIME-INR
INR: 1.1 (ref 0.8–1.2)
Prothrombin Time: 14.4 s (ref 11.4–15.2)

## 2023-11-07 LAB — PREGNANCY, URINE: Preg Test, Ur: NEGATIVE

## 2023-11-07 LAB — RESP PANEL BY RT-PCR (RSV, FLU A&B, COVID)  RVPGX2
Influenza A by PCR: NEGATIVE
Influenza B by PCR: NEGATIVE
Resp Syncytial Virus by PCR: NEGATIVE
SARS Coronavirus 2 by RT PCR: NEGATIVE

## 2023-11-07 LAB — T4, FREE: Free T4: 0.83 ng/dL (ref 0.61–1.12)

## 2023-11-07 LAB — TROPONIN T, HIGH SENSITIVITY: Troponin T High Sensitivity: <15 ng/L (ref <19)

## 2023-11-07 LAB — CK: Total CK: 32 U/L — ABNORMAL LOW (ref 38–234)

## 2023-11-07 LAB — D-DIMER, QUANTITATIVE: D-Dimer, Quant: 1.15 ug{FEU}/mL — ABNORMAL HIGH (ref 0.00–0.50)

## 2023-11-07 MED ORDER — VANCOMYCIN HCL IN DEXTROSE 1-5 GM/200ML-% IV SOLN
1000.0000 mg | Freq: Once | INTRAVENOUS | Status: AC
  Administered 2023-11-07: 1000 mg via INTRAVENOUS
  Filled 2023-11-07: qty 200

## 2023-11-07 MED ORDER — ACETAMINOPHEN 500 MG PO TABS
1000.0000 mg | ORAL_TABLET | Freq: Once | ORAL | Status: AC
  Administered 2023-11-07: 1000 mg via ORAL
  Filled 2023-11-07: qty 2

## 2023-11-07 MED ORDER — IOHEXOL 350 MG/ML SOLN
75.0000 mL | Freq: Once | INTRAVENOUS | Status: AC | PRN
  Administered 2023-11-07: 75 mL via INTRAVENOUS

## 2023-11-07 MED ORDER — SODIUM CHLORIDE 0.9 % IV SOLN
2.0000 g | Freq: Once | INTRAVENOUS | Status: AC
  Administered 2023-11-07: 2 g via INTRAVENOUS
  Filled 2023-11-07: qty 12.5

## 2023-11-07 MED ORDER — METRONIDAZOLE 500 MG/100ML IV SOLN
500.0000 mg | Freq: Once | INTRAVENOUS | Status: AC
  Administered 2023-11-07: 500 mg via INTRAVENOUS
  Filled 2023-11-07: qty 100

## 2023-11-07 MED ORDER — LACTATED RINGERS IV SOLN: INTRAVENOUS | Status: DC

## 2023-11-07 MED ORDER — DIPHENHYDRAMINE HCL 50 MG/ML IJ SOLN
25.0000 mg | Freq: Once | INTRAMUSCULAR | Status: AC
  Administered 2023-11-07: 25 mg via INTRAVENOUS
  Filled 2023-11-07: qty 1

## 2023-11-07 MED ORDER — LACTATED RINGERS IV BOLUS
1000.0000 mL | Freq: Once | INTRAVENOUS | Status: AC
  Administered 2023-11-07: 1000 mL via INTRAVENOUS

## 2023-11-07 MED ORDER — METOCLOPRAMIDE HCL 5 MG/ML IJ SOLN
10.0000 mg | Freq: Once | INTRAMUSCULAR | Status: AC
  Administered 2023-11-07: 10 mg via INTRAVENOUS
  Filled 2023-11-07: qty 2

## 2023-11-07 NOTE — ED Notes (Signed)
 Called [facility] for transport spoke with Tequila at 10p

## 2023-11-07 NOTE — Progress Notes (Signed)
 Elink monitoring for the code sepsis protocol.

## 2023-11-07 NOTE — Progress Notes (Signed)
 Patient arrived to [id number] via [facility] from [facility]. [facility] Admit paged to notify of patient's arrival. Care ongoing.

## 2023-11-07 NOTE — ED Provider Notes (Signed)
 Physical Exam  BP 110/68   Pulse 95   Temp 99.2 F (37.3 C)   Resp 16   Ht 5\' 4"  (1.626 m)   Wt 66.7 kg   SpO2 99%   BMI 25.23 kg/m   Physical Exam Vitals and nursing note reviewed.  Constitutional:      General: She is not in acute distress.    Appearance: Normal appearance.  HENT:     Head: Normocephalic and atraumatic.     Mouth/Throat:     Mouth: Mucous membranes are moist.     Pharynx: Oropharynx is clear.  Eyes:     Extraocular Movements: Extraocular movements intact.     Conjunctiva/sclera: Conjunctivae normal.     Pupils: Pupils are equal, round, and reactive to light.  Cardiovascular:     Rate and Rhythm: Normal rate and regular rhythm.     Pulses: Normal pulses.     Heart sounds: Normal heart sounds. No murmur heard.    No friction rub. No gallop.  Pulmonary:     Effort: Pulmonary effort is normal.     Breath sounds: Normal breath sounds.  Abdominal:     General: Abdomen is flat. Bowel sounds are normal.     Palpations: Abdomen is soft.  Musculoskeletal:        General: Normal range of motion.     Cervical back: Normal range of motion and neck supple.     Right lower leg: No edema.     Left lower leg: No edema.  Skin:    General: Skin is warm and dry.     Capillary Refill: Capillary refill takes less than 2 seconds.  Neurological:     General: No focal deficit present.     Mental Status: She is alert and oriented to person, place, and time. Mental status is at baseline.     GCS: GCS eye subscore is 4. GCS verbal subscore is 5. GCS motor subscore is 6.  Psychiatric:        Mood and Affect: Mood normal.     Procedures  Procedures  ED Course / MDM   Clinical Course as of 11/07/23 2156  Thu Nov 07, 2023  1826 D-Dimer, Quant(!): 1.15 Will obtain CT A of chest. [CR]  2019 Bacteria, UA(!): MANY Source of infection identified, will activate code sepsis [SG]    Clinical Course User Index [CR] [place] Butter, PA [SG] Teddi Favors, DO    Medical Decision Making Amount and/or Complexity of Data Reviewed Labs: ordered. Decision-making details documented in ED Course. Radiology: ordered.  Risk OTC drugs. Prescription drug management. Decision regarding hospitalization.   Reassessed patient, she states that subjectively her condition is improved however she still has initial symptoms.  At time of handoff, care plan is to await results of CT abdomen and CT chest to determine etiology of leukocytosis.  Based on markedly elevated white count, this patient will eventually need admission for sepsis.  Medical Decision Making:   Caitlin Hoffman is a 48 y.o. female who presented to the ED today with generalized body aches and headache detailed above.   She has been given extensive IV antibiotics with cefepime , vancomycin , and Flagyl  and is also received fluid resuscitation with lactated Ringer 's 1 L.  Continuing to get LR infusion at this time.  Patient placed on continuous vitals and telemetry monitoring while in ED which was reviewed periodically.   Initial Study Results:   Laboratory  All laboratory results reviewed without evidence of clinically  relevant pathology.   Exceptions include: Leukocytosis of 23.9 with noted neutrophilia of 20.1  EKG EKG was reviewed independently. Rate, rhythm, axis, intervals all examined and without medically relevant abnormality. ST segments without concerns for elevations.    Radiology:  All images reviewed independently. Agree with radiology report at this time.   CT ABDOMEN PELVIS W CONTRAST Result Date: 11/07/2023 CLINICAL DATA:  Sepsis. Tachycardia, shortness of breath, chest pain. Generalized body aches. EXAM: CT ABDOMEN AND PELVIS WITH CONTRAST TECHNIQUE: Multidetector CT imaging of the abdomen and pelvis was performed using the standard protocol following bolus administration of intravenous contrast. RADIATION DOSE REDUCTION: This exam was performed according to the departmental  dose-optimization program which includes automated exposure control, adjustment of the mA and/or kV according to patient size and/or use of iterative reconstruction technique. CONTRAST:  75mL OMNIPAQUE  IOHEXOL  350 MG/ML SOLN COMPARISON:  03/20/2023 FINDINGS: Lower chest: Dependent atelectasis in the lower lobes. No effusions. Hepatobiliary: No focal hepatic abnormality. Gallbladder unremarkable. Pancreas: No focal abnormality or ductal dilatation. Spleen: No focal abnormality.  Normal size. Adrenals/Urinary Tract: Area of low-density in the lower pole of the right kidney measures 2.4 cm and is new since prior study. There is mild perinephric stranding noted around the lower pole of the right kidney. This is concerning for area of pyelonephritis and possible early abscess formation. No hydronephrosis. Adrenal glands and urinary bladder unremarkable. Stomach/Bowel: Stomach, large and small bowel grossly unremarkable. Vascular/Lymphatic: No evidence of aneurysm or adenopathy. Reproductive: Uterus and adnexa unremarkable.  No mass. Other: No free fluid or free air. Musculoskeletal: No acute bony abnormality. IMPRESSION: Area of low-density in the lower pole of the right kidney concerning for area of pyelonephritis and possible early abscess formation. Surrounding perinephric stranding. Electronically Signed   By: Janeece Mechanic M.D.   On: 11/07/2023 21:12   CT Angio Chest PE W/Cm &/Or Wo Cm Result Date: 11/07/2023 CLINICAL DATA:  Pulmonary embolism (PE) suspected, low to intermediate prob, positive D-dimer. Generalized body aches. Chest pain, shortness of breath, tachycardia. Elevated D-dimer. EXAM: CT ANGIOGRAPHY CHEST WITH CONTRAST TECHNIQUE: Multidetector CT imaging of the chest was performed using the standard protocol during bolus administration of intravenous contrast. Multiplanar CT image reconstructions and MIPs were obtained to evaluate the vascular anatomy. RADIATION DOSE REDUCTION: This exam was performed  according to the departmental dose-optimization program which includes automated exposure control, adjustment of the mA and/or kV according to patient size and/or use of iterative reconstruction technique. CONTRAST:  75mL OMNIPAQUE  IOHEXOL  350 MG/ML SOLN COMPARISON:  None Available. FINDINGS: Cardiovascular: No filling defects in the pulmonary arteries to suggest pulmonary emboli. Heart is normal size. Aorta is normal caliber. Mediastinum/Nodes: No mediastinal, hilar, or axillary adenopathy. Trachea and esophagus are unremarkable. Multiple nodules within the right thyroid  lobe, the largest measuring 2.1 cm. Lungs/Pleura: Scattered ground-glass and dependent opacities in the lower lobes, favor atelectasis. No confluent opacities or effusions. 4 mm right upper lobe nodule on image 55. 2 mm subpleural right middle lobe pulmonary nodule on image 65. no pleural effusions. Upper Abdomen: No acute findings Musculoskeletal: Chest wall soft tissues are unremarkable. No acute bony abnormality. Review of the MIP images confirms the above findings. IMPRESSION: No evidence of pulmonary embolus. Scattered ground-glass and linear opacities in the lower lobes, mostly dependent, likely atelectasis. Small right lung pulmonary nodules measuring up to 4 mm. No follow-up needed if patient is low-risk (and has no known or suspected primary neoplasm). Non-contrast chest CT can be considered in 12 months if patient is  high-risk. This recommendation follows the consensus statement: Guidelines for Management of Incidental Pulmonary Nodules Detected on CT Images: From the Fleischner Society 2017; Radiology 2017; 284:228-243. Right thyroid  nodules measuring up to 2.1 cm. Recommend further evaluation with nonemergent thyroid  ultrasound. Electronically Signed   By: Janeece Mechanic M.D.   On: 11/07/2023 21:09   DG Chest 2 View Result Date: 11/07/2023 CLINICAL DATA:  Chest pain EXAM: CHEST - 2 VIEW COMPARISON:  02/06/2009 FINDINGS: Heart and  mediastinal contours are within normal limits. No focal opacities or effusions. No acute bony abnormality. IMPRESSION: No active cardiopulmonary disease. Electronically Signed   By: Janeece Mechanic M.D.   On: 11/07/2023 19:13      Consults: Case discussed with Case discussed with Dr Brice Campi with hospitalist team who will be accepting patient for admission.. .   Reassessment and Plan:   Based on clinical exam along with lab results showing profound leukocytosis and findings consistent with a urinary infection, will admit patient to hospitalist service for continued IV antibiotics.  Case was discussed as noted and accepted by hospitalist service for admission.         Juanetta Nordmann, PA 11/07/23 2157    Teddi Favors, DO 11/12/23 [phone]

## 2023-11-07 NOTE — ED Provider Notes (Signed)
 [facility] Provider Note   CSN: [id number] Arrival date & time: 11/07/23  1642     History  Chief Complaint  Patient presents with   Generalized Body Aches   Headache    Caitlin Hoffman is a 48 y.o. female.   Headache   48 year old female presents emergency department with complaints of bodyaches, headache.  States that her symptoms began yesterday afternoon when she began to have bodyaches as well as chills.  Reports dull headache beginning at that time.  Denies any known fever.  Was seen at urgent care earlier today and was given medicines for her headache without improvement prompting referral to the ED.  Patient while at the urgent care, did develop some left-sided chest pain as well as shortness of breath and feeling of palpitations.  States that the symptoms were somewhat transient and she is not experiencing them currently.   Denies any cough, congestion, abdominal pain, nausea, vomiting or urinary symptoms, vaginal symptoms, change in bowel habits.  Past medical history significant for hypothyroidism, GERD, anxiety, depression  Home Medications Prior to Admission medications   Medication Sig Start Date End Date Taking? Authorizing Provider  ciprofloxacin  (CIPRO ) 500 MG tablet Take 1 tablet (500 mg total) by mouth every 12 (twelve) hours. 03/20/23   Charmayne Paydon Carll, MD  GARLIC PO Take by mouth daily.    [provider]  ibuprofen  (ADVIL ) 600 MG tablet take 1 tablet po pc every 6 hours for 5 days then prn-post operative pain 08/22/20   Ivin Marrow, PA-C  Multiple Vitamins-Minerals (WOMENS MULTIVITAMIN PO) Take by mouth daily.    [provider]  ondansetron  (ZOFRAN -ODT) 4 MG disintegrating tablet Take 1 tablet (4 mg total) by mouth every 8 (eight) hours as needed for nausea or vomiting. 03/20/23   Charmayne Clancy Mullarkey, MD  oxyCODONE -acetaminophen  (PERCOCET/ROXICET) 5-325 MG tablet take 1 tablet po every 6 hours as  needed for breakthrough post operative pain 08/22/20   Ivin Marrow, PA-C  phenazopyridine  (PYRIDIUM ) 200 MG tablet Take 1 tablet (200 mg total) by mouth 3 (three) times daily. 08/25/20   Burnette Carte, MD  XULANE 150-35 MCG/24HR transdermal patch Apply 1 patch every week by transdermal route. 11/24/21   [provider]      Allergies    Patient has no known allergies.    Review of Systems   Review of Systems  Neurological:  Positive for headaches.  All other systems reviewed and are negative.   Physical Exam Updated Vital Signs BP 113/73   Pulse (!) 116   Temp 99.2 F (37.3 C)   Resp 14   Ht 5\' 4"  (1.626 m)   Wt 66.7 kg   SpO2 97%   BMI 25.23 kg/m  Physical Exam Vitals and nursing note reviewed.  Constitutional:      General: She is not in acute distress.    Appearance: She is well-developed.  HENT:     Head: Normocephalic and atraumatic.  Eyes:     Conjunctiva/sclera: Conjunctivae normal.  Neck:     Comments: Kernig and Brezinski negative. Cardiovascular:     Rate and Rhythm: Regular rhythm. Tachycardia present.     Heart sounds: No murmur heard. Pulmonary:     Effort: Pulmonary effort is normal. No respiratory distress.     Breath sounds: Normal breath sounds. No wheezing, rhonchi or rales.  Abdominal:     Palpations: Abdomen is soft.     Tenderness: There is no abdominal  tenderness. There is no guarding.  Musculoskeletal:        General: No swelling.     Cervical back: Normal range of motion and neck supple. No rigidity or tenderness.  Skin:    General: Skin is warm and dry.     Capillary Refill: Capillary refill takes less than 2 seconds.  Neurological:     Mental Status: She is alert.     Comments: Alert and oriented to self, place, time and event.   Speech is fluent, clear without dysarthria or dysphasia.   Strength 5/5 in upper/lower extremities   Sensation intact in upper/lower extremities   Normal gait.  CN I not tested  CN II not  tested CN III, IV, VI PERRLA and EOMs intact bilaterally  CN V Intact sensation to sharp and light touch to the face  CN VII facial movements symmetric  CN VIII not tested  CN IX, X no uvula deviation, symmetric rise of soft palate  CN XI 5/5 SCM and trapezius strength bilaterally  CN XII Midline tongue protrusion, symmetric L/R movements     Psychiatric:        Mood and Affect: Mood normal.     ED Results / Procedures / Treatments   Labs (all labs ordered are listed, but only abnormal results are displayed) Labs Reviewed  CBC - Abnormal; Notable for the following components:      Result Value   WBC 23.9 (*)    All other components within normal limits  DIFFERENTIAL - Abnormal; Notable for the following components:   Neutro Abs 20.1 (*)    Monocytes Absolute 2.6 (*)    Abs Immature Granulocytes 0.18 (*)    All other components within normal limits  RESP PANEL BY RT-PCR (RSV, FLU A&B, COVID)  RVPGX2  CULTURE, BLOOD (ROUTINE X 2)  CULTURE, BLOOD (ROUTINE X 2)  PROTIME-INR  BASIC METABOLIC PANEL WITH GFR  PREGNANCY, URINE  TSH  T4, FREE  LACTIC ACID, PLASMA  LACTIC ACID, PLASMA  HEPATIC FUNCTION PANEL  URINALYSIS, W/ REFLEX TO CULTURE (INFECTION SUSPECTED)  CK  D-DIMER, QUANTITATIVE  TROPONIN T, HIGH SENSITIVITY    EKG None  Radiology No results found.  Procedures .Critical Care  Performed by: [place] Butter, PA Authorized by: [place] Butter, PA   Critical care provider statement:    Critical care time (minutes):  53   Critical care was necessary to treat or prevent imminent or life-threatening deterioration of the following conditions:  Sepsis   Critical care was time spent personally by me on the following activities:  Development of treatment plan with patient or surrogate, discussions with consultants, evaluation of patient's response to treatment, examination of patient, ordering and review of laboratory studies, ordering and review of radiographic  studies, ordering and performing treatments and interventions, pulse oximetry, re-evaluation of patient's condition and review of old charts   I assumed direction of critical care for this patient from another provider in my specialty: no       Medications Ordered in ED Medications  lactated ringers  infusion ( Intravenous New Bag/Given 11/07/23 1745)  lactated ringers  bolus 1,000 mL (1,000 mLs Intravenous New Bag/Given 11/07/23 1745)  metoCLOPramide  (REGLAN ) injection 10 mg (10 mg Intravenous Given 11/07/23 1746)  diphenhydrAMINE  (BENADRYL ) injection 25 mg (25 mg Intravenous Given 11/07/23 1745)  acetaminophen  (TYLENOL ) tablet 1,000 mg (1,000 mg Oral Given 11/07/23 1746)    ED Course/ Medical Decision Making/ A&P Clinical Course as of 11/07/23 1829  Thu Nov 07, 2023  1826 D-Dimer, Quant(!): 1.15 Will obtain CT A of chest. [CR]    Clinical Course User Index [CR] [place] Butter, PA                                 Medical Decision Making Amount and/or Complexity of Data Reviewed Labs: ordered. Radiology: ordered.  Risk OTC drugs. Prescription drug management.   This patient presents to the ED for concern of headache, chills, this involves an extensive number of treatment options, and is a complaint that carries with it a high risk of complications and morbidity.  The differential diagnosis includes viral URI, COVID, flu, RSV, thyroid  storm, thyrotoxicosis, UTI, pyelonephritis, pneumonia, meningitis, sepsis, other   Co morbidities that complicate the patient evaluation  See HPI   Additional history obtained:  Additional history obtained from EMR External records from outside source obtained and reviewed including hospital records   Lab Tests:  I Ordered, and personally interpreted labs.  The pertinent results include: Leukocytosis of 23,000 with left shift.  No evidence of anemia.  Placed within range.  No transaminitis.  CK normal.  D-dimer elevated at 1.15.  PT/INR  within normal limits.  Lactic acid within normal limits.  Viral testing negative.  Urine pregnancy negative.   Imaging Studies ordered:  I ordered imaging studies including chest x-ray, CT angio chest PE, CT abdomen pelvis I independently visualized and interpreted imaging which showed  Chest x-ray:pending CT angio chest PE: Pending CT abdomen pelvis: Pending. I agree with the radiologist interpretation   Cardiac Monitoring: / EKG:  The patient was maintained on a cardiac monitor.  I personally viewed and interpreted the cardiac monitored which showed an underlying rhythm of: sinus tachycardia.  Borderline repolarization pattern.  Right atrial enlargement.   Consultations Obtained:  I requested consultation with attending Dr. Martina Sledge who is in agreement treatment plan going forward   Problem List / ED Course / Critical interventions / Medication management  Sirs, headache, bodyache, chills I ordered medication including Tylenol , Reglan , Benadryl , lactated Ringer 's, cefepime , Flagyl , vancomycin    Reevaluation of the patient after these medicines showed that the patient improved I have reviewed the patients home medicines and have made adjustments as needed   Social Determinants of Health:  Former cigarette use.  Denies illicit drug use.   Test / Admission - Considered:  Sirs, headache, bodyache, chills, tachycardia Vitals signs significant for initial tachycardia heart rate in the 120s/130s otherwise within normal range and stable throughout visit. Laboratory/imaging studies significant for: See above 48 year old female presents emergency department with complaints of bodyaches, headache.  States that her symptoms began yesterday afternoon when she began to have bodyaches as well as chills.  Reports dull headache beginning at that time.  Denies any known fever.  Was seen at urgent care earlier today and was given medicines for her headache without improvement prompting referral  to the ED.  Patient while at the urgent care, did develop some left-sided chest pain as well as shortness of breath and feeling of palpitations.  States that the symptoms were somewhat transient and she is not experiencing them currently.   Denies any cough, congestion, abdominal pain, nausea, vomiting or urinary symptoms, vaginal symptoms, change in bowel habits. On exam, lungs clear to auscultation bilaterally.  No obvious wheeze, rales, rhonchi on exam.  No abdominal tenderness.  Nonfocal neuroexam.  No evidence clinically of meningismus.  Treated with headache cocktail did note  improvement of headache.  Labs concerning for significant leukocytosis with left shift of 23,000 with normal lactic acid.  With patient's tachycardia, meeting SIRS criteria so sepsis protocol was performed.  Chest x-ray did not show any obvious pneumonia per clinical interpretation but waiting review radiologist.  D-dimer was obtained at the beginning of workup given patient's tachycardia with having had chest pain and feeling of palpitations and on OCP; this also was positive so awaiting CT imaging of patient's chest as well as abdomen/pelvis for further workup regarding elevated D-dimer and further assessment regarding source of infectious etiology.  Patient began on antibiotics for unknown source of sepsis.  At time of shift change, patient care handed off to [facility]. Patient stable upon shift change.         Final Clinical Impression(s) / ED Diagnoses Final diagnoses:  None    Rx / DC Orders ED Discharge Orders     None         [place] Butter, [place] 11/07/23 1907    Teddi Favors, DO 11/12/23 1551

## 2023-11-07 NOTE — Progress Notes (Signed)
 Plan of Care Note for accepted transfer   Patient: Caitlin Hoffman MRN: [id number]   DOA: 11/07/2023  Facility requesting transfer: [facility]   Requesting Provider: Marjorie Sieving, PA   Reason for transfer: Pyelonephritis   Facility course: 48 yr old lady with hyperthyroidism presents with general aches and malaise. She was tachycardic to 130 initially and noted to have WBC of 23,900 with normal lactate and stable BP.   CT is concerning for right pyelonephritis with possible early renal abscess. She was cultured and given IVF and antibiotics.   Plan of care: The patient is accepted for admission to Med-surg  unit, at [facility].   Author: Walton Guppy, MD 11/07/2023  Check [email or web address] for on-call coverage.  Nursing staff, Please call [facility] Admits & Consults System-Wide number on Amion as soon as patient's arrival, so appropriate admitting provider can evaluate the pt.

## 2023-11-07 NOTE — ED Triage Notes (Signed)
 Pt arrives with c/o generalized body aches that started yesterday afternoon. Pt endorses headache. Pt denies fever. Pt recently switched birth control about 1-2 weeks. Pt endorses CP and SOB. Pt is tachycardic in the 130s in triage. Pt has hx of Hyperthyroidism.

## 2023-11-08 ENCOUNTER — Inpatient Hospital Stay (HOSPITAL_COMMUNITY)

## 2023-11-08 DIAGNOSIS — N12 Tubulo-interstitial nephritis, not specified as acute or chronic: Secondary | ICD-10-CM | POA: Diagnosis not present

## 2023-11-08 DIAGNOSIS — R9431 Abnormal electrocardiogram [ECG] [EKG]: Secondary | ICD-10-CM | POA: Diagnosis not present

## 2023-11-08 LAB — CBC
HCT: 36.2 % (ref 36.0–46.0)
Hemoglobin: 12.1 g/dL (ref 12.0–15.0)
MCH: 29 pg (ref 26.0–34.0)
MCHC: 33.4 g/dL (ref 30.0–36.0)
MCV: 86.8 fL (ref 80.0–100.0)
Platelets: 260 10*3/uL (ref 150–400)
RBC: 4.17 MIL/uL (ref 3.87–5.11)
RDW: 13.7 % (ref 11.5–15.5)
WBC: 19.3 10*3/uL — ABNORMAL HIGH (ref 4.0–10.5)
nRBC: 0 % (ref 0.0–0.2)

## 2023-11-08 LAB — BASIC METABOLIC PANEL WITH GFR
Anion gap: 9 (ref 5–15)
BUN: <5 mg/dL — ABNORMAL LOW (ref 6–20)
CO2: 20 mmol/L — ABNORMAL LOW (ref 22–32)
Calcium: 8.6 mg/dL — ABNORMAL LOW (ref 8.9–10.3)
Chloride: 107 mmol/L (ref 98–111)
Creatinine, Ser: 0.56 mg/dL (ref 0.44–1.00)
GFR, Estimated: >60 mL/min (ref >60)
Glucose, Bld: 113 mg/dL — ABNORMAL HIGH (ref 70–99)
Potassium: 3.8 mmol/L (ref 3.5–5.1)
Sodium: 136 mmol/L (ref 135–145)

## 2023-11-08 LAB — HEMOGLOBIN A1C
Hgb A1c MFr Bld: 5.3 % (ref 4.8–5.6)
Mean Plasma Glucose: 105.41 mg/dL

## 2023-11-08 LAB — HIV ANTIBODY (ROUTINE TESTING W REFLEX): HIV Screen 4th Generation wRfx: NONREACTIVE

## 2023-11-08 LAB — ECHOCARDIOGRAM COMPLETE
AR max vel: 1.95 cm2
AV Area VTI: 2.21 cm2
AV Area mean vel: 1.94 cm2
AV Mean grad: 8 mmHg
AV Peak grad: 13 mmHg
Ao pk vel: 1.8 m/s
Area-P 1/2: 5.7 cm2
Height: 64 in
S' Lateral: 3 cm
Weight: 2352 [oz_av]

## 2023-11-08 LAB — URINE CULTURE: Culture: <10000 — AB

## 2023-11-08 LAB — PHOSPHORUS: Phosphorus: 2.4 mg/dL — ABNORMAL LOW (ref 2.5–4.6)

## 2023-11-08 LAB — MAGNESIUM: Magnesium: 1.9 mg/dL (ref 1.7–2.4)

## 2023-11-08 MED ORDER — POLYETHYLENE GLYCOL 3350 17 G PO PACK: 17.0000 g | PACK | Freq: Every day | ORAL | Status: DC | PRN

## 2023-11-08 MED ORDER — MELATONIN 5 MG PO TABS
5.0000 mg | ORAL_TABLET | Freq: Every evening | ORAL | Status: DC | PRN
  Administered 2023-11-08: 5 mg via ORAL
  Filled 2023-11-08: qty 1

## 2023-11-08 MED ORDER — PIPERACILLIN-TAZOBACTAM 3.375 G IVPB
3.3750 g | Freq: Three times a day (TID) | INTRAVENOUS | Status: DC
  Administered 2023-11-08 – 2023-11-09 (×5): 3.375 g via INTRAVENOUS
  Filled 2023-11-08 (×5): qty 50

## 2023-11-08 MED ORDER — MORPHINE SULFATE (PF) 2 MG/ML IV SOLN: 2.0000 mg | INTRAVENOUS | Status: DC | PRN

## 2023-11-08 MED ORDER — K PHOS MONO-SOD PHOS DI & MONO 155-852-130 MG PO TABS
500.0000 mg | ORAL_TABLET | Freq: Two times a day (BID) | ORAL | Status: DC
  Administered 2023-11-08 – 2023-11-09 (×3): 500 mg via ORAL
  Filled 2023-11-08 (×3): qty 2

## 2023-11-08 MED ORDER — KETOROLAC TROMETHAMINE 15 MG/ML IJ SOLN
15.0000 mg | INTRAMUSCULAR | Status: AC
  Administered 2023-11-08: 15 mg via INTRAVENOUS
  Filled 2023-11-08: qty 1

## 2023-11-08 MED ORDER — LACTATED RINGERS IV SOLN: INTRAVENOUS | Status: DC

## 2023-11-08 MED ORDER — OXYCODONE HCL 5 MG PO TABS: 5.0000 mg | ORAL_TABLET | Freq: Four times a day (QID) | ORAL | Status: DC | PRN

## 2023-11-08 MED ORDER — PROCHLORPERAZINE EDISYLATE 10 MG/2ML IJ SOLN: 5.0000 mg | Freq: Four times a day (QID) | INTRAMUSCULAR | Status: DC | PRN

## 2023-11-08 MED ORDER — ENOXAPARIN SODIUM 40 MG/0.4ML IJ SOSY
40.0000 mg | PREFILLED_SYRINGE | Freq: Every day | INTRAMUSCULAR | Status: DC
  Filled 2023-11-08 (×2): qty 0.4

## 2023-11-08 MED ORDER — ACETAMINOPHEN 325 MG PO TABS
650.0000 mg | ORAL_TABLET | Freq: Four times a day (QID) | ORAL | Status: DC | PRN
  Filled 2023-11-08: qty 2

## 2023-11-08 NOTE — Progress Notes (Addendum)
 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  She was found to be febrile, tachycardic, leukocytosis, UA positive for pyuria.  CT abdomen and pelvis with contrast revealed low density in the lower pole of the right kidney concerning for area of pyelonephritis and possible early abscess formation.   Assessment & Plan:   Principal Problem:   Pyelonephritis   1-Sepsis secondary to pyelonephritis POA Patient presented with flank pain, fever, leukocytosis, tachycardia.  CT abdomen and pelvis with finding concerning for pyelonephritis or early abscess. -Blood culture: no growth to date.  -FU urine culture.  -will review Case with urology.  She has history of Gynecare TVT mesh.  WIll check her for diabetes.   Subclinical hyperthyroidism Incidental finding right thyroid nodule measuring 2.1 cm US ; Thyroid: Multinodular goiter. She will need FNA of 1.2 cm left superior TR5 and 2 cm Left inferior TR4.  Needs follow up with Endocrinologist. Patient made aware.   History of rheumatic fever diagnosed as a child -she related she was treated with antibiotics.   Incidental finding cardiac murmur Follow echo  Hypophosphatemia; replaced orally   Estimated body mass index is 25.23 kg/m as calculated from the following:   Height as of this encounter: 5\' 4"  (1.626 m).   Weight as of this encounter: 66.7 kg.   DVT prophylaxis: Lovenox Code Status: Full code Family Communication: care discussed with patient na husband who was at bedside.  Disposition Plan:  Status is: Inpatient Remains inpatient appropriate because: management of sepsis./     Consultants:  none  Procedures:  Thyroid US   Antimicrobials:     Subjective: She is alert, feels better today. She report headaches is better. Flank pain improved.  She has history of Mesh for urinary incontinence, since them she feels she has been getting more urine infection.   Objective: Vitals:   11/07/23 2130 11/07/23 2217 11/07/23 2327 11/08/23 0337  BP: 111/65  105/64 120/71  Pulse: 96  96 (!) 101  Resp:   16 16  Temp:  98.9 F (37.2 C) 99.7 F (37.6 C) (!) 100.4 F (38 C)  TempSrc:  Oral Oral Oral  SpO2: 99%  99% 99%  Weight:      Height:       No intake or output data in the 24 hours ending 11/08/23 0753 Filed Weights   11/07/23 1700  Weight: 66.7 kg    Examination:  General exam: Appears calm and comfortable  Respiratory system: Clear to auscultation. Respiratory effort normal. Cardiovascular system: S1 & S2 heard, RRR. No JVD, murmurs, rubs, gallops or clicks. No pedal edema. Gastrointestinal system: Abdomen is nondistended, soft and nontender. No organomegaly or masses felt. Normal bowel sounds heard. Central nervous system: Alert and oriented. No focal neurological deficits. Extremities: Symmetric 5 x 5 power.    Data Reviewed: I have personally reviewed following labs and imaging studies  CBC: Recent Labs  Lab 11/07/23 1706 11/07/23 1737  WBC 23.9*  --   NEUTROABS  --  20.1*  HGB 13.0  --   HCT 38.8  --   MCV 86.4  --   PLT 305  --    Basic Metabolic Panel: Recent Labs  Lab 11/07/23 1706  NA 135  K 3.8  CL 100  CO2 20*  GLUCOSE 156*  BUN 8  CREATININE 0.89  CALCIUM 8.9   GFR: Estimated Creatinine Clearance: 72.6 mL/min (by C-G formula based on SCr of 0.89 mg/dL). Liver Function Tests: Recent Labs  Lab 11/07/23 1737  AST 17  ALT 11  ALKPHOS 119  BILITOT 0.6  PROT 7.7  ALBUMIN 4.0   No results for input(s): "LIPASE", "AMYLASE" in the last 168 hours. No results for input(s): "AMMONIA" in the last 168 hours. Coagulation Profile: Recent Labs  Lab 11/07/23 1737  INR 1.1   Cardiac  Enzymes: Recent Labs  Lab 11/07/23 1737  CKTOTAL 32*   BNP (last 3 results) No results for input(s): "PROBNP" in the last 8760 hours. HbA1C: No results for input(s): "HGBA1C" in the last 72 hours. CBG: No results for input(s): "GLUCAP" in the last 168 hours. Lipid Profile: No results for input(s): "CHOL", "HDL", "LDLCALC", "TRIG", "CHOLHDL", "LDLDIRECT" in the last 72 hours. Thyroid Function Tests: Recent Labs    11/07/23 1737  TSH 0.038*  FREET4 0.83   Anemia Panel: No results for input(s): "VITAMINB12", "FOLATE", "FERRITIN", "TIBC", "IRON", "RETICCTPCT" in the last 72 hours. Sepsis Labs: Recent Labs  Lab 11/07/23 1737  LATICACIDVEN 1.8    Recent Results (from the past 240 hours)  Resp panel by RT-PCR (RSV, Flu A&B, Covid) Anterior Nasal Swab     Status: None   Collection Time: 11/07/23  5:37 PM   Specimen: Anterior Nasal Swab  Result Value Ref Range Status   SARS Coronavirus 2 by RT PCR NEGATIVE NEGATIVE Final    Comment: (NOTE) SARS-CoV-2 target nucleic acids are NOT DETECTED.  The SARS-CoV-2 RNA is generally detectable in upper respiratory specimens during the acute phase of infection. The lowest concentration of SARS-CoV-2 viral copies this assay can detect is 138 copies/mL. A negative result does not preclude SARS-Cov-2 infection and should not be used as the sole basis for treatment or other patient management decisions. A negative result may occur with  improper specimen collection/handling, submission of specimen other than nasopharyngeal swab, presence of viral mutation(s) within the areas targeted by this assay, and inadequate number of viral copies(<138 copies/mL). A negative result must be combined with clinical observations, patient history, and epidemiological information. The expected result is Negative.  Fact Sheet for Patients:  BloggerCourse.com  Fact Sheet for Healthcare Providers:   SeriousBroker.it  This test is no t yet approved or cleared by the [place]  FDA and  has been authorized for detection and/or diagnosis of SARS-CoV-2 by FDA under an Emergency Use Authorization (EUA). This EUA will remain  in effect (meaning this test can be used) for the duration of the COVID-19 declaration under Section 564(b)(1) of the Act, 21 U.S.C.section 360bbb-3(b)(1), unless the authorization is terminated  or revoked sooner.       Influenza A by PCR NEGATIVE NEGATIVE Final   Influenza B by PCR NEGATIVE NEGATIVE Final    Comment: (NOTE) The Xpert Xpress SARS-CoV-2/FLU/RSV plus assay is intended as an aid in the diagnosis of influenza from Nasopharyngeal swab specimens and should not be used as a sole basis for treatment. Nasal washings and aspirates are unacceptable for Xpert Xpress SARS-CoV-2/FLU/RSV testing.  Fact Sheet for Patients: BloggerCourse.com  Fact Sheet for Healthcare Providers: SeriousBroker.it  This test is not yet approved or cleared by the [place]  FDA and has been authorized for detection and/or diagnosis of SARS-CoV-2 by FDA under an Emergency Use Authorization (EUA). This EUA will remain in effect (meaning this test can be  used) for the duration of the COVID-19 declaration under Section 564(b)(1) of the Act, 21 U.S.C. section 360bbb-3(b)(1), unless the authorization is terminated or revoked.     Resp Syncytial Virus by PCR NEGATIVE NEGATIVE Final    Comment: (NOTE) Fact Sheet for Patients: BloggerCourse.com  Fact Sheet for Healthcare Providers: SeriousBroker.it  This test is not yet approved or cleared by the [place]  FDA and has been authorized for detection and/or diagnosis of SARS-CoV-2 by FDA under an Emergency Use Authorization (EUA). This EUA will remain in effect (meaning this test can be used) for  the duration of the COVID-19 declaration under Section 564(b)(1) of the Act, 21 U.S.C. section 360bbb-3(b)(1), unless the authorization is terminated or revoked.  Performed at [facility], [address], [place], [place] [address]          Radiology Studies: CT ABDOMEN PELVIS W CONTRAST Result Date: 11/07/2023 CLINICAL DATA:  Sepsis. Tachycardia, shortness of breath, chest pain. Generalized body aches. EXAM: CT ABDOMEN AND PELVIS WITH CONTRAST TECHNIQUE: Multidetector CT imaging of the abdomen and pelvis was performed using the standard protocol following bolus administration of intravenous contrast. RADIATION DOSE REDUCTION: This exam was performed according to the departmental dose-optimization program which includes automated exposure control, adjustment of the mA and/or kV according to patient size and/or use of iterative reconstruction technique. CONTRAST:  75mL OMNIPAQUE  IOHEXOL  350 MG/ML SOLN COMPARISON:  03/20/2023 FINDINGS: Lower chest: Dependent atelectasis in the lower lobes. No effusions. Hepatobiliary: No focal hepatic abnormality. Gallbladder unremarkable. Pancreas: No focal abnormality or ductal dilatation. Spleen: No focal abnormality.  Normal size. Adrenals/Urinary Tract: Area of low-density in the lower pole of the right kidney measures 2.4 cm and is new since prior study. There is mild perinephric stranding noted around the lower pole of the right kidney. This is concerning for area of pyelonephritis and possible early abscess formation. No hydronephrosis. Adrenal glands and urinary bladder unremarkable. Stomach/Bowel: Stomach, large and small bowel grossly unremarkable. Vascular/Lymphatic: No evidence of aneurysm or adenopathy. Reproductive: Uterus and adnexa unremarkable.  No mass. Other: No free fluid or free air. Musculoskeletal: No acute bony abnormality. IMPRESSION: Area of low-density in the lower pole of the right kidney concerning for area of pyelonephritis  and possible early abscess formation. Surrounding perinephric stranding. Electronically Signed   By: Janeece Mechanic M.D.   On: 11/07/2023 21:12   CT Angio Chest PE W/Cm &/Or Wo Cm Result Date: 11/07/2023 CLINICAL DATA:  Pulmonary embolism (PE) suspected, low to intermediate prob, positive D-dimer. Generalized body aches. Chest pain, shortness of breath, tachycardia. Elevated D-dimer. EXAM: CT ANGIOGRAPHY CHEST WITH CONTRAST TECHNIQUE: Multidetector CT imaging of the chest was performed using the standard protocol during bolus administration of intravenous contrast. Multiplanar CT image reconstructions and MIPs were obtained to evaluate the vascular anatomy. RADIATION DOSE REDUCTION: This exam was performed according to the departmental dose-optimization program which includes automated exposure control, adjustment of the mA and/or kV according to patient size and/or use of iterative reconstruction technique. CONTRAST:  75mL OMNIPAQUE  IOHEXOL  350 MG/ML SOLN COMPARISON:  None Available. FINDINGS: Cardiovascular: No filling defects in the pulmonary arteries to suggest pulmonary emboli. Heart is normal size. Aorta is normal caliber. Mediastinum/Nodes: No mediastinal, hilar, or axillary adenopathy. Trachea and esophagus are unremarkable. Multiple nodules within the right thyroid lobe, the largest measuring 2.1 cm. Lungs/Pleura: Scattered ground-glass and dependent opacities in the lower lobes, favor atelectasis. No confluent opacities or effusions. 4 mm right upper lobe nodule on image 55. 2 mm  subpleural right middle lobe pulmonary nodule on image 65. no pleural effusions. Upper Abdomen: No acute findings Musculoskeletal: Chest wall soft tissues are unremarkable. No acute bony abnormality. Review of the MIP images confirms the above findings. IMPRESSION: No evidence of pulmonary embolus. Scattered ground-glass and linear opacities in the lower lobes, mostly dependent, likely atelectasis. Small right lung pulmonary  nodules measuring up to 4 mm. No follow-up needed if patient is low-risk (and has no known or suspected primary neoplasm). Non-contrast chest CT can be considered in 12 months if patient is high-risk. This recommendation follows the consensus statement: Guidelines for Management of Incidental Pulmonary Nodules Detected on CT Images: From the Fleischner Society 2017; Radiology 2017; 284:228-243. Right thyroid  nodules measuring up to 2.1 cm. Recommend further evaluation with nonemergent thyroid  ultrasound. Electronically Signed   By: Janeece Mechanic M.D.   On: 11/07/2023 21:09   DG Chest 2 View Result Date: 11/07/2023 CLINICAL DATA:  Chest pain EXAM: CHEST - 2 VIEW COMPARISON:  02/06/2009 FINDINGS: Heart and mediastinal contours are within normal limits. No focal opacities or effusions. No acute bony abnormality. IMPRESSION: No active cardiopulmonary disease. Electronically Signed   By: Janeece Mechanic M.D.   On: 11/07/2023 19:13        Scheduled Meds:  enoxaparin  (LOVENOX ) injection  40 mg Subcutaneous QHS   Continuous Infusions:  lactated ringers  125 mL/hr at 11/07/23 1745   piperacillin -tazobactam 3.375 g (11/08/23 0144)     LOS: 1 day    Time spent: 35 minutes    Alitza Cowman A Larita Deremer, MD [facility]   If 7PM-7AM, please contact night-coverage [email or web address]  11/08/2023, 7:53 AM

## 2023-11-08 NOTE — Consult Note (Signed)
 Urology Consult Note   Requesting Attending Physician:  Danette Duos, MD Service Providing Consult: Urology  Consulting Attending: Dr. Valeta Gaudier   Reason for Consult:  pyelonephritis with possible right lower pole abscess development  HPI: Caitlin Hoffman is seen in consultation for reasons noted above at the request of Danette Duos, MD. patient is a 48 year old female presenting to [facility] on 10/08/2023 with complaint of fever, generalized body ache, and right flank pain.  ED labs significant for leukocytosis of 23,000, pyuria.  Patient was also confirmed febrile and tachycardic at that time.  CT A/P noted a low-density area of the lower pole of her right kidney concerning for pyelonephritis and possible early abscess formation.  Blood and urine cultures are pending.  She also noted that she has had increased urinary tract infections since placement of her mid urethral sling with Dr. Matthias Sor in 08/22/2020.  On arrival patient was alert, oriented, no distress.  She was about to get into the shower.  She is employed as a [employer] forces operational officer.  We reviewed her difficulties with her improved but ongoing stress incontinence.  She has established with a new gynecologist within the same practice but has not brought these concerns up with them.  ------------------  Assessment:  48 y.o. female with right pyelonephritis with possible abscess formation   Recommendations: # Right pyelonephritis # Recurrent UTI CT A/P independently reviewed.  Area of hypoattenuation in the right lower pole is clearly identified.  It does not appear liquid enough at this time to necessitate drainage.  I have suggested that primary team query interventional radiology as to whether they feel there is intervention needed at this time.  Otherwise, continue treating with broad-spectrum coverage while awaiting speciation and sensitivities.  As of now patient has remained normothermic over the course of the day and  shown interval improvement in leukocytosis.  Should she worsen acutely then I would recommend a repeat contrasted scan of her abdomen.  Otherwise we will she will establish with us  and we will follow-up outpatient for repeat CT after she has completed her antibiotics in a couple of weeks.  [facility] [address] floor [place], [place]  [address] [phone]    # Urge incontinence This has been managed by, and mid urethral sling placed by her gynecologist.  She will bring this up at her next visit.  Her urge incontinence has almost certainly worsened in the short-term secondary to her ongoing UTI/pyelo-.  Additionally she has started on Loestrin to mitigate some of her premenopausal symptoms.  The additional estrogen may also diminish the frequency of her UTIs.  Urology will not need to follow.  Please feel free to call with questions or acute changes.  Case and plan discussed with Dr. Valeta Gaudier  Past Medical History: Past Medical History:  Diagnosis Date   Anxiety    Depression    Genital herpes    GERD (gastroesophageal reflux disease)    History of hyperthyroidism    previously seen by endocrinologist, dr Washington Hacker (lov in epic 02-23-2009) stated very mild then resolved after normal labs   Hyperthyroidism    Osteopenia    SUI (stress urinary incontinence, female)    Wears contact lenses    Wears contact lenses     Past Surgical History:  Past Surgical History:  Procedure Laterality Date   BLADDER SUSPENSION N/A 08/22/2020   Procedure: TRANSVAGINAL TAPE (TVT) PROCEDURE;  Surgeon: Artemisa Bile, MD;  Location: [facility];  Service: Gynecology;  Laterality: N/A;   CYSTOSCOPY N/A 08/22/2020   Procedure: CYSTOSCOPY;  Surgeon: Artemisa Bile, MD;  Location[facility];  Service: Gynecology;  Laterality: N/A;   NO PAST SURGERIES      Medication: Current Facility-Administered Medications  Medication Dose Route Frequency  Provider Last Rate Last Admin   acetaminophen  (TYLENOL ) tablet 650 mg  650 mg Oral Q6H PRN Del Favia, Carole N, DO       enoxaparin (LOVENOX) injection 40 mg  40 mg Subcutaneous QHS Hall, Carole N, DO       lactated ringers  infusion   Intravenous Continuous Regalado, Belkys A, MD 100 mL/hr at 11/08/23 1206 Restarted at 11/08/23 1206   melatonin tablet 5 mg  5 mg Oral QHS PRN Bary Boss, DO       morphine  (PF) 2 MG/ML injection 2 mg  2 mg Intravenous Q3H PRN Reesa Cannon N, DO       oxyCODONE  (Oxy IR/ROXICODONE ) immediate release tablet 5 mg  5 mg Oral Q6H PRN Reesa Cannon N, DO       phosphorus (K PHOS NEUTRAL) tablet 500 mg  500 mg Oral BID Regalado, Belkys A, MD       piperacillin-tazobactam (ZOSYN) IVPB 3.375 g  3.375 g Intravenous Q8H Baldemar Lev, RPH 12.5 mL/hr at 11/08/23 0843 3.375 g at 11/08/23 0843   polyethylene glycol (MIRALAX / GLYCOLAX) packet 17 g  17 g Oral Daily PRN Reesa Cannon N, DO       prochlorperazine (COMPAZINE) injection 5 mg  5 mg Intravenous Q6H PRN Reesa Cannon N, DO        Allergies: No Known Allergies  Social History: Social History   Tobacco Use   Smoking status: Former    Current packs/day: 0.00    Types: Cigarettes    Start date: 08/18/2005    Quit date: 08/19/2010    Years since quitting: 13.2   Smokeless tobacco: Never   Tobacco comments:    socially  Vaping Use   Vaping status: Never Used  Substance Use Topics   Alcohol use: Yes    Alcohol/week: 0.0 standard drinks of alcohol    Comment: occasional   Drug use: Never    Family History Family History  Problem Relation Age of Onset   Gastric cancer Mother        Stomach cancer?   BRCA 1/2 Neg Hx    Breast cancer Neg Hx     Review of Systems  Genitourinary:  Positive for urgency. Negative for dysuria, flank pain, frequency and hematuria.     Objective   Vital signs in last 24 hours: BP 118/71 (BP Location: Left Arm)   Pulse 93   Temp 99 F (37.2 C) (Oral)   Resp 19   Ht 5'  4" (1.626 m)   Wt 66.7 kg   SpO2 99%   BMI 25.23 kg/m   Physical Exam General: A&O, resting, appropriate HEENT: [place]/AT Pulmonary: Normal work of breathing Cardiovascular: no cyanosis Neuro: Appropriate, no focal neurological deficits  Most Recent Labs: Lab Results  Component Value Date   WBC 19.3 (H) 11/08/2023   HGB 12.1 11/08/2023   HCT 36.2 11/08/2023   PLT 260 11/08/2023    Lab Results  Component Value Date   NA 136 11/08/2023   K 3.8 11/08/2023   CL 107 11/08/2023   CO2 20 (L) 11/08/2023   BUN <5 (L) 11/08/2023   CREATININE 0.56 11/08/2023   CALCIUM 8.6 (L) 11/08/2023  MG 1.9 11/08/2023   PHOS 2.4 (L) 11/08/2023    Lab Results  Component Value Date   INR 1.1 11/07/2023     Urine Culture: @[id number] (laburin,org,[id number])@   IMAGING: US  THYROID Result Date: 11/08/2023 CLINICAL DATA:  Multiple nodules noted on recent CT chest EXAM: THYROID ULTRASOUND TECHNIQUE: Ultrasound examination of the thyroid gland and adjacent soft tissues was performed. COMPARISON:  CTA 11/07/2023 FINDINGS: Parenchymal Echotexture: Moderately heterogenous Isthmus: 0.3 cm thickness Right lobe: 4.1 x 2.2 x 2.5 cm Left lobe: 3.7 x 1.3 x 1.3 cm _________________________________________________________ Estimated total number of nodules >/= 1 cm: 4 Number of spongiform nodules >/=  2 cm not described below (TR1): 0 Number of mixed cystic and solid nodules >/= 1.5 cm not described below (TR2): 0 _________________________________________________________ Nodule # 1: Location: Right; inferior Maximum size: 2.49 cm; Other 2 dimensions: 2.1 x 2.4 cm Composition: solid/almost completely solid (2) Echogenicity: isoechoic (1) Shape: not taller-than-wide (0) Margins: ill-defined (0) Echogenic foci: none (0) ACR TI-RADS total points: 3. ACR TI-RADS risk category: TR 3. ACR TI-RADS recommendations: *Given size (>/= 1.5 - 2.4 cm) and appearance, a follow-up ultrasound in 1 year should be considered based on  TI-RADS criteria. _________________________________________________________ Nodule # 2: Location: Right; superior Maximum size: 2.4 cm; Other 2 dimensions: 1.6 x 1.8 cm Composition: solid/almost completely solid (2) Echogenicity: hyperechoic (1) Shape: not taller-than-wide (0) Margins: smooth (0) Echogenic foci: none (0) ACR TI-RADS total points: 3. ACR TI-RADS risk category: TR 3. ACR TI-RADS recommendations: *Given size (>/= 1.5 - 2.4 cm) and appearance, a follow-up ultrasound in 1 year should be considered based on TI-RADS criteria. _________________________________________________________ Nodule # 3: Location: Left; superior Maximum size: 1.2 cm; Other 2 dimensions: 0.8 x 1 cm Composition: solid/almost completely solid (2) Echogenicity: hypoechoic (2) Shape: taller-than-wide (3) Margins: smooth (0) Echogenic foci: none (0) ACR TI-RADS total points: 7. ACR TI-RADS risk category: TR 5. ACR TI-RADS recommendations: **Given size (>/= 1.0 cm) and appearance, fine needle aspiration of this highly suspicious nodule should be considered based on TI-RADS criteria. _________________________________________________________ Nodule # 4: Location: Left; inferior Maximum size: 2 cm; Other 2 dimensions: 1.1 x 1.4 cm Composition: solid/almost completely solid (2) Echogenicity: hypoechoic (2) Shape: not taller-than-wide (0) Margins: smooth (0) Echogenic foci: none (0) ACR TI-RADS total points: 4. ACR TI-RADS risk category: TR 4. ACR TI-RADS recommendations: **Given size (>/= 1.5 cm) and appearance, fine needle aspiration of this moderately suspicious nodule should be considered based on TI-RADS criteria. _________________________________________________________ No regional cervical adenopathy identified. IMPRESSION: 1. Multinodular thyroid. 2. Recommend FNA biopsy of 1.2 cm left superior TR5 and 2 cm left inferior TR4 nodules. 3. Recommend annual/biennial ultrasound follow-up of additional nodules as above, until stability x5  years confirmed. The above is in keeping with the ACR TI-RADS recommendations - J Am Coll Radiol 2017;14:587-595. Electronically Signed   By: Nicoletta Barrier M.D.   On: 11/08/2023 12:34   CT ABDOMEN PELVIS W CONTRAST Result Date: 11/07/2023 CLINICAL DATA:  Sepsis. Tachycardia, shortness of breath, chest pain. Generalized body aches. EXAM: CT ABDOMEN AND PELVIS WITH CONTRAST TECHNIQUE: Multidetector CT imaging of the abdomen and pelvis was performed using the standard protocol following bolus administration of intravenous contrast. RADIATION DOSE REDUCTION: This exam was performed according to the departmental dose-optimization program which includes automated exposure control, adjustment of the mA and/or kV according to patient size and/or use of iterative reconstruction technique. CONTRAST:  75mL OMNIPAQUE  IOHEXOL  350 MG/ML SOLN COMPARISON:  03/20/2023 FINDINGS: Lower chest: Dependent atelectasis in the lower  lobes. No effusions. Hepatobiliary: No focal hepatic abnormality. Gallbladder unremarkable. Pancreas: No focal abnormality or ductal dilatation. Spleen: No focal abnormality.  Normal size. Adrenals/Urinary Tract: Area of low-density in the lower pole of the right kidney measures 2.4 cm and is new since prior study. There is mild perinephric stranding noted around the lower pole of the right kidney. This is concerning for area of pyelonephritis and possible early abscess formation. No hydronephrosis. Adrenal glands and urinary bladder unremarkable. Stomach/Bowel: Stomach, large and small bowel grossly unremarkable. Vascular/Lymphatic: No evidence of aneurysm or adenopathy. Reproductive: Uterus and adnexa unremarkable.  No mass. Other: No free fluid or free air. Musculoskeletal: No acute bony abnormality. IMPRESSION: Area of low-density in the lower pole of the right kidney concerning for area of pyelonephritis and possible early abscess formation. Surrounding perinephric stranding. Electronically Signed   By:  Janeece Mechanic M.D.   On: 11/07/2023 21:12   CT Angio Chest PE W/Cm &/Or Wo Cm Result Date: 11/07/2023 CLINICAL DATA:  Pulmonary embolism (PE) suspected, low to intermediate prob, positive D-dimer. Generalized body aches. Chest pain, shortness of breath, tachycardia. Elevated D-dimer. EXAM: CT ANGIOGRAPHY CHEST WITH CONTRAST TECHNIQUE: Multidetector CT imaging of the chest was performed using the standard protocol during bolus administration of intravenous contrast. Multiplanar CT image reconstructions and MIPs were obtained to evaluate the vascular anatomy. RADIATION DOSE REDUCTION: This exam was performed according to the departmental dose-optimization program which includes automated exposure control, adjustment of the mA and/or kV according to patient size and/or use of iterative reconstruction technique. CONTRAST:  75mL OMNIPAQUE  IOHEXOL  350 MG/ML SOLN COMPARISON:  None Available. FINDINGS: Cardiovascular: No filling defects in the pulmonary arteries to suggest pulmonary emboli. Heart is normal size. Aorta is normal caliber. Mediastinum/Nodes: No mediastinal, hilar, or axillary adenopathy. Trachea and esophagus are unremarkable. Multiple nodules within the right thyroid lobe, the largest measuring 2.1 cm. Lungs/Pleura: Scattered ground-glass and dependent opacities in the lower lobes, favor atelectasis. No confluent opacities or effusions. 4 mm right upper lobe nodule on image 55. 2 mm subpleural right middle lobe pulmonary nodule on image 65. no pleural effusions. Upper Abdomen: No acute findings Musculoskeletal: Chest wall soft tissues are unremarkable. No acute bony abnormality. Review of the MIP images confirms the above findings. IMPRESSION: No evidence of pulmonary embolus. Scattered ground-glass and linear opacities in the lower lobes, mostly dependent, likely atelectasis. Small right lung pulmonary nodules measuring up to 4 mm. No follow-up needed if patient is low-risk (and has no known or suspected  primary neoplasm). Non-contrast chest CT can be considered in 12 months if patient is high-risk. This recommendation follows the consensus statement: Guidelines for Management of Incidental Pulmonary Nodules Detected on CT Images: From the Fleischner Society 2017; Radiology 2017; 284:228-243. Right thyroid nodules measuring up to 2.1 cm. Recommend further evaluation with nonemergent thyroid ultrasound. Electronically Signed   By: Janeece Mechanic M.D.   On: 11/07/2023 21:09   DG Chest 2 View Result Date: 11/07/2023 CLINICAL DATA:  Chest pain EXAM: CHEST - 2 VIEW COMPARISON:  02/06/2009 FINDINGS: Heart and mediastinal contours are within normal limits. No focal opacities or effusions. No acute bony abnormality. IMPRESSION: No active cardiopulmonary disease. Electronically Signed   By: Janeece Mechanic M.D.   On: 11/07/2023 19:13    ------  Alla Ar, NP Pager: [phone]   Please contact the urology consult pager with any further questions/concerns.

## 2023-11-08 NOTE — H&P (Signed)
 History and Physical  Gladiola Sui VQQ:[id number] DOB: 19-Jun-1975 DOA: 11/07/2023  Referring physician: Accepted by Dr. Opyd, [facility], hospitalist service. PCP: [facility]  Outpatient Specialists: None. Patient coming from: Home through [facility] ED.  Chief Complaint: Right flank pain, malaise.  HPI: Caitlin Hoffman is a 48 y.o. female with medical history significant for rheumatic fever diagnosed as a child, who initially presented to [facility] ED with complaints of generalized bodyaches that started yesterday afternoon associated with right flank pain.  In the ER, febrile and tachycardic with leukocytosis 23,000.  UA was positive for pyuria.  CT abdomen pelvis with contrast revealed area of low density in the lower pole of the right kidney concerning for area of pyelonephritis and possible early abscess formation.  Surrounding perinephric stranding.  Code sepsis was called in the ER.  Peripheral blood cultures x 2 and urine culture were obtained.  The patient was started on broad-spectrum IV antibiotics, IV fluid, and IV analgesics.  [facility], hospitalist service, was asked to admit.  Accepted by Dr. Brice Campi and transferred to [facility] MedSurg unit as inpatient status.  ED Course: Temperature 100.4.  BP 110/71, pulse 101, respiration rate 16, O2 saturation 99% on room air.  Lab studies notable for WBC 23.9.  Neutrophil count 20.1.  Serum bicarb 20, glucose 156, lactic acid 1.8.  Review of Systems: Review of systems as noted in the HPI. All other systems reviewed and are negative.   Past Medical History:  Diagnosis Date   Anxiety    Depression    Genital herpes    GERD (gastroesophageal reflux disease)    History of hyperthyroidism    previously seen by endocrinologist, dr Washington Hacker (lov in epic 02-23-2009) stated very mild then resolved after normal labs   Hyperthyroidism    Osteopenia    SUI (stress urinary incontinence, female)    Wears contact lenses    Wears contact lenses     Past Surgical History:  Procedure Laterality Date   BLADDER SUSPENSION N/A 08/22/2020   Procedure: TRANSVAGINAL TAPE (TVT) PROCEDURE;  Surgeon: Artemisa Bile, MD;  Location: [facility];  Service: Gynecology;  Laterality: N/A;   CYSTOSCOPY N/A 08/22/2020   Procedure: CYSTOSCOPY;  Surgeon: Artemisa Bile, MD;  Location: [facility];  Service: Gynecology;  Laterality: N/A;   NO PAST SURGERIES      Social History:  reports that she quit smoking about 13 years ago. Her smoking use included cigarettes. She started smoking about 18 years ago. She has never used smokeless tobacco. She reports current alcohol use. She reports that she does not use drugs.   No Known Allergies  Family History  Problem Relation Age of Onset   Gastric cancer Mother        Stomach cancer?   BRCA 1/2 Neg Hx    Breast cancer Neg Hx       Prior to Admission medications   Medication Sig Start Date End Date Taking? Authorizing Provider  ciprofloxacin  (CIPRO ) 500 MG tablet Take 1 tablet (500 mg total) by mouth every 12 (twelve) hours. 03/20/23   Charmayne Cooper, MD  GARLIC PO Take by mouth daily.    [provider]  ibuprofen  (ADVIL ) 600 MG tablet take 1 tablet po pc every 6 hours for 5 days then prn-post operative pain 08/22/20   Ivin Marrow, PA-C  Multiple Vitamins-Minerals (WOMENS MULTIVITAMIN PO) Take by mouth daily.    [provider]  ondansetron  (ZOFRAN -ODT) 4 MG disintegrating tablet Take  1 tablet (4 mg total) by mouth every 8 (eight) hours as needed for nausea or vomiting. 03/20/23   Charmayne Cooper, MD  oxyCODONE -acetaminophen  (PERCOCET/ROXICET) 5-325 MG tablet take 1 tablet po every 6 hours as needed for breakthrough post operative pain 08/22/20   Ivin Marrow, PA-C  phenazopyridine  (PYRIDIUM ) 200 MG tablet Take 1 tablet (200 mg total) by mouth 3 (three) times daily. 08/25/20   Burnette Carte, MD  XULANE 150-35 MCG/24HR transdermal patch Apply 1 patch every  week by transdermal route. 11/24/21   [provider]    Physical Exam: BP 105/64 (BP Location: Left Arm)   Pulse 96   Temp 99.7 F (37.6 C) (Oral)   Resp 16   Ht 5\' 4"  (1.626 m)   Wt 66.7 kg   SpO2 99%   BMI 25.23 kg/m   General: 48 y.o. year-old female well developed well nourished in no acute distress.  Alert and oriented x3. Cardiovascular: Regular rate and rhythm with no rubs or gallops.  No thyromegaly or JVD noted.  No lower extremity edema. 2/4 pulses in all 4 extremities.  Moderate intensity murmur noted on exam. Respiratory: Clear to auscultation with no wheezes or rales. Good inspiratory effort. Abdomen: Soft nontender nondistended with normal bowel sounds x4 quadrants. Muskuloskeletal: No cyanosis, clubbing or edema noted bilaterally Neuro: CN II-XII intact, strength, sensation, reflexes Skin: No ulcerative lesions noted or rashes Psychiatry: Judgement and insight appear normal. Mood is appropriate for condition and setting          Labs on Admission:  Basic Metabolic Panel: Recent Labs  Lab 11/07/23 1706  NA 135  K 3.8  CL 100  CO2 20*  GLUCOSE 156*  BUN 8  CREATININE 0.89  CALCIUM 8.9   Liver Function Tests: Recent Labs  Lab 11/07/23 1737  AST 17  ALT 11  ALKPHOS 119  BILITOT 0.6  PROT 7.7  ALBUMIN 4.0   No results for input(s): "LIPASE", "AMYLASE" in the last 168 hours. No results for input(s): "AMMONIA" in the last 168 hours. CBC: Recent Labs  Lab 11/07/23 1706 11/07/23 1737  WBC 23.9*  --   NEUTROABS  --  20.1*  HGB 13.0  --   HCT 38.8  --   MCV 86.4  --   PLT 305  --    Cardiac Enzymes: Recent Labs  Lab 11/07/23 1737  CKTOTAL 32*    BNP (last 3 results) No results for input(s): "BNP" in the last 8760 hours.  ProBNP (last 3 results) No results for input(s): "PROBNP" in the last 8760 hours.  CBG: No results for input(s): "GLUCAP" in the last 168 hours.  Radiological Exams on Admission: CT ABDOMEN PELVIS W  CONTRAST Result Date: 11/07/2023 CLINICAL DATA:  Sepsis. Tachycardia, shortness of breath, chest pain. Generalized body aches. EXAM: CT ABDOMEN AND PELVIS WITH CONTRAST TECHNIQUE: Multidetector CT imaging of the abdomen and pelvis was performed using the standard protocol following bolus administration of intravenous contrast. RADIATION DOSE REDUCTION: This exam was performed according to the departmental dose-optimization program which includes automated exposure control, adjustment of the mA and/or kV according to patient size and/or use of iterative reconstruction technique. CONTRAST:  75mL OMNIPAQUE  IOHEXOL  350 MG/ML SOLN COMPARISON:  03/20/2023 FINDINGS: Lower chest: Dependent atelectasis in the lower lobes. No effusions. Hepatobiliary: No focal hepatic abnormality. Gallbladder unremarkable. Pancreas: No focal abnormality or ductal dilatation. Spleen: No focal abnormality.  Normal size. Adrenals/Urinary Tract: Area of low-density in the lower pole of the right kidney  measures 2.4 cm and is new since prior study. There is mild perinephric stranding noted around the lower pole of the right kidney. This is concerning for area of pyelonephritis and possible early abscess formation. No hydronephrosis. Adrenal glands and urinary bladder unremarkable. Stomach/Bowel: Stomach, large and small bowel grossly unremarkable. Vascular/Lymphatic: No evidence of aneurysm or adenopathy. Reproductive: Uterus and adnexa unremarkable.  No mass. Other: No free fluid or free air. Musculoskeletal: No acute bony abnormality. IMPRESSION: Area of low-density in the lower pole of the right kidney concerning for area of pyelonephritis and possible early abscess formation. Surrounding perinephric stranding. Electronically Signed   By: Janeece Mechanic M.D.   On: 11/07/2023 21:12   CT Angio Chest PE W/Cm &/Or Wo Cm Result Date: 11/07/2023 CLINICAL DATA:  Pulmonary embolism (PE) suspected, low to intermediate prob, positive D-dimer.  Generalized body aches. Chest pain, shortness of breath, tachycardia. Elevated D-dimer. EXAM: CT ANGIOGRAPHY CHEST WITH CONTRAST TECHNIQUE: Multidetector CT imaging of the chest was performed using the standard protocol during bolus administration of intravenous contrast. Multiplanar CT image reconstructions and MIPs were obtained to evaluate the vascular anatomy. RADIATION DOSE REDUCTION: This exam was performed according to the departmental dose-optimization program which includes automated exposure control, adjustment of the mA and/or kV according to patient size and/or use of iterative reconstruction technique. CONTRAST:  75mL OMNIPAQUE  IOHEXOL  350 MG/ML SOLN COMPARISON:  None Available. FINDINGS: Cardiovascular: No filling defects in the pulmonary arteries to suggest pulmonary emboli. Heart is normal size. Aorta is normal caliber. Mediastinum/Nodes: No mediastinal, hilar, or axillary adenopathy. Trachea and esophagus are unremarkable. Multiple nodules within the right thyroid lobe, the largest measuring 2.1 cm. Lungs/Pleura: Scattered ground-glass and dependent opacities in the lower lobes, favor atelectasis. No confluent opacities or effusions. 4 mm right upper lobe nodule on image 55. 2 mm subpleural right middle lobe pulmonary nodule on image 65. no pleural effusions. Upper Abdomen: No acute findings Musculoskeletal: Chest wall soft tissues are unremarkable. No acute bony abnormality. Review of the MIP images confirms the above findings. IMPRESSION: No evidence of pulmonary embolus. Scattered ground-glass and linear opacities in the lower lobes, mostly dependent, likely atelectasis. Small right lung pulmonary nodules measuring up to 4 mm. No follow-up needed if patient is low-risk (and has no known or suspected primary neoplasm). Non-contrast chest CT can be considered in 12 months if patient is high-risk. This recommendation follows the consensus statement: Guidelines for Management of Incidental Pulmonary  Nodules Detected on CT Images: From the Fleischner Society 2017; Radiology 2017; 284:228-243. Right thyroid nodules measuring up to 2.1 cm. Recommend further evaluation with nonemergent thyroid ultrasound. Electronically Signed   By: Janeece Mechanic M.D.   On: 11/07/2023 21:09   DG Chest 2 View Result Date: 11/07/2023 CLINICAL DATA:  Chest pain EXAM: CHEST - 2 VIEW COMPARISON:  02/06/2009 FINDINGS: Heart and mediastinal contours are within normal limits. No focal opacities or effusions. No acute bony abnormality. IMPRESSION: No active cardiopulmonary disease. Electronically Signed   By: Janeece Mechanic M.D.   On: 11/07/2023 19:13    EKG: I independently viewed the EKG done and my findings are as followed: Sinus tachycardia rate of 126, consider right atrial enlargement, borderline repolarization abnormalities different leads.  QTc 429.  Assessment/Plan Present on Admission:  Pyelonephritis  Principal Problem:   Pyelonephritis  Sepsis secondary to right pyelonephritis, POA Follow urine culture and peripheral blood cultures for ID and sensitivities Continue broad-spectrum IV antibiotics, IV Zosyn Supportive care IV fluid Pain control Consider urology consultation in  the morning.  Sinus tachycardia In the setting of sepsis Continue to treat underlying conditions Continue IV fluid hydration  Subclinical hyperthyroidism Incidental finding right thyroid nodule measuring up to 2.1 cm TSH 0.038, free T4, 0.83. Follow thyroid ultrasound Referral to endocrinology at discharge is recommended  History of rheumatic fever, diagnosed as a child The patient does not know whether or not she was treated.  Incidental finding cardiac murmur best heard at left sternal border Follow 2D echo    Critical care: 55 minutes.    DVT prophylaxis: Subcu Lovenox daily.  Code Status: Full code.  Family Communication: None at bedside.  Disposition Plan: Admitted to MedSurg unit.  Consults called:  None.  Admission status: Inpatient status.   Status is: Inpatient The patient requires at least 2 midnights for further evaluation and treatment of present condition.   Bary Boss MD [facility] Pager [phone]  If 7PM-7AM, please contact night-coverage [email or web address] Password [email or web address]  11/08/2023, 12:40 AM

## 2023-11-09 DIAGNOSIS — N12 Tubulo-interstitial nephritis, not specified as acute or chronic: Secondary | ICD-10-CM | POA: Diagnosis not present

## 2023-11-09 LAB — BASIC METABOLIC PANEL WITH GFR
Anion gap: 13 (ref 5–15)
BUN: 7 mg/dL (ref 6–20)
CO2: 21 mmol/L — ABNORMAL LOW (ref 22–32)
Calcium: 8.7 mg/dL — ABNORMAL LOW (ref 8.9–10.3)
Chloride: 102 mmol/L (ref 98–111)
Creatinine, Ser: 0.65 mg/dL (ref 0.44–1.00)
GFR, Estimated: >60 mL/min (ref >60)
Glucose, Bld: 149 mg/dL — ABNORMAL HIGH (ref 70–99)
Potassium: 3.6 mmol/L (ref 3.5–5.1)
Sodium: 136 mmol/L (ref 135–145)

## 2023-11-09 LAB — PHOSPHORUS: Phosphorus: 3.7 mg/dL (ref 2.5–4.6)

## 2023-11-09 LAB — CBC
HCT: 35 % — ABNORMAL LOW (ref 36.0–46.0)
Hemoglobin: 11.5 g/dL — ABNORMAL LOW (ref 12.0–15.0)
MCH: 28.5 pg (ref 26.0–34.0)
MCHC: 32.9 g/dL (ref 30.0–36.0)
MCV: 86.8 fL (ref 80.0–100.0)
Platelets: 284 10*3/uL (ref 150–400)
RBC: 4.03 MIL/uL (ref 3.87–5.11)
RDW: 13.8 % (ref 11.5–15.5)
WBC: 11.5 10*3/uL — ABNORMAL HIGH (ref 4.0–10.5)
nRBC: 0 % (ref 0.0–0.2)

## 2023-11-09 LAB — T3, FREE: T3, Free: 2.7 pg/mL (ref 2.0–4.4)

## 2023-11-09 MED ORDER — K PHOS MONO-SOD PHOS DI & MONO 155-852-130 MG PO TABS: 500.0000 mg | ORAL_TABLET | Freq: Two times a day (BID) | ORAL | 0 refills | Status: AC

## 2023-11-09 MED ORDER — CIPROFLOXACIN HCL 500 MG PO TABS
500.0000 mg | ORAL_TABLET | Freq: Two times a day (BID) | ORAL | Status: DC
  Filled 2023-11-09 (×2): qty 1

## 2023-11-09 MED ORDER — CIPROFLOXACIN HCL 500 MG PO TABS: 500.0000 mg | ORAL_TABLET | Freq: Two times a day (BID) | ORAL | 0 refills | Status: AC

## 2023-11-09 NOTE — Progress Notes (Signed)
 Subjective: Patient feeling much better this morning. Ambulating, tolerating diet without issues. States she wants to go home.   Objective: Vital signs in last 24 hours: Temp:  [98 F (36.7 C)-99.6 F (37.6 C)] 98 F (36.7 C) (05/31 0809) Pulse Rate:  [81-104] 81 (05/31 0809) Resp:  [19-20] 20 (05/31 0809) BP: (98-123)/(62-75) 98/62 (05/31 0809) SpO2:  [97 %-100 %] 100 % (05/31 0809)  Assessment/Plan:  48 y.o. female with right pyelonephritis    Recommendations:  # Right pyelonephritis # Recurrent UTI IR consulted, no drainable collection Patient improving on abx, afebrile overnight, CVAT resolved Ucx <10,000 colonies  If condition changes and she becomes hemodynamically unstable, please re-image with a contrasted CT A/P Safe for discharge from urologic perspective     # Urge incontinence This has been managed by, and mid urethral sling placed by her gynecologist.  She will bring this up at her next visit.  Her urge incontinence has almost certainly worsened in the short-term secondary to her ongoing UTI/pyelo-.  Additionally she has started on Loestrin to mitigate some of her premenopausal symptoms.  The additional estrogen may also diminish the frequency of her UTIs.   Urology will not need to follow.  Please feel free to call with questions or acute changes.  Intake/Output from previous day: No intake/output data recorded.  Intake/Output this shift: No intake/output data recorded.  Physical Exam:  General: Alert and oriented CV: No cyanosis Lungs: equal chest rise Abdomen: Soft, NTND, no rebound or guarding Skin: Gu: voiding spontaneously, no CVAT  Lab Results: Recent Labs    11/07/23 1706 11/08/23 0850 11/09/23 0918  HGB 13.0 12.1 11.5*  HCT 38.8 36.2 35.0*   BMET Recent Labs    11/08/23 0850 11/09/23 0918  NA 136 136  K 3.8 3.6  CL 107 102  CO2 20* 21*  GLUCOSE 113* 149*  BUN <5* 7  CREATININE 0.56 0.65  CALCIUM 8.6* 8.7*      Studies/Results: ECHOCARDIOGRAM COMPLETE Result Date: 11/08/2023    ECHOCARDIOGRAM REPORT   Patient Name:   Caitlin Hoffman Date of Exam: 11/08/2023 Medical Rec #:  [id number]     Height:       64.0 in Accession #:    [id number]    Weight:       147.0 lb Date of Birth:  1975/12/18      BSA:          1.716 m Patient Age:    48 years      BP:           120/71 mmHg Patient Gender: F             HR:           94 bpm. Exam Location:  Inpatient Procedure: 2D Echo, Cardiac Doppler and Color Doppler (Both Spectral and Color            Flow Doppler were utilized during procedure). Indications:    Abnormal ECG R94.31  History:        Patient has no prior history of Echocardiogram examinations.  Sonographer:    Astrid Blamer Referring Phys: CAROLE N HALL IMPRESSIONS  1. Left ventricular ejection fraction, by estimation, is 65 to 70%. The left ventricle has normal function. The left ventricle has no regional wall motion abnormalities. Left ventricular diastolic parameters were normal.  2. Right ventricular systolic function is normal. The right ventricular size is normal.  3. The mitral valve is normal in structure. No  evidence of mitral valve regurgitation. No evidence of mitral stenosis.  4. The aortic valve is normal in structure. Aortic valve regurgitation is not visualized. No aortic stenosis is present.  5. The inferior vena cava is normal in size with greater than 50% respiratory variability, suggesting right atrial pressure of 3 mmHg. FINDINGS  Left Ventricle: Left ventricular ejection fraction, by estimation, is 65 to 70%. The left ventricle has normal function. The left ventricle has no regional wall motion abnormalities. The left ventricular internal cavity size was normal in size. There is  no left ventricular hypertrophy. Left ventricular diastolic parameters were normal. Right Ventricle: The right ventricular size is normal. No increase in right ventricular wall thickness. Right ventricular systolic function  is normal. Left Atrium: Left atrial size was normal in size. Right Atrium: Right atrial size was normal in size. Pericardium: There is no evidence of pericardial effusion. Mitral Valve: The mitral valve is normal in structure. No evidence of mitral valve regurgitation. No evidence of mitral valve stenosis. Tricuspid Valve: The tricuspid valve is normal in structure. Tricuspid valve regurgitation is trivial. No evidence of tricuspid stenosis. Aortic Valve: The aortic valve is normal in structure. Aortic valve regurgitation is not visualized. No aortic stenosis is present. Aortic valve mean gradient measures 8.0 mmHg. Aortic valve peak gradient measures 13.0 mmHg. Aortic valve area, by VTI measures 2.21 cm. Pulmonic Valve: The pulmonic valve was normal in structure. Pulmonic valve regurgitation is not visualized. No evidence of pulmonic stenosis. Aorta: The aortic root is normal in size and structure. Venous: The inferior vena cava is normal in size with greater than 50% respiratory variability, suggesting right atrial pressure of 3 mmHg. IAS/Shunts: No atrial level shunt detected by color flow Doppler.  LEFT VENTRICLE PLAX 2D LVIDd:         4.20 cm   Diastology LVIDs:         3.00 cm   LV e' medial:    14.00 cm/s LV PW:         0.80 cm   LV E/e' medial:  7.4 LV IVS:        0.80 cm   LV e' lateral:   11.60 cm/s LVOT diam:     1.80 cm   LV E/e' lateral: 8.9 LV SV:         70 LV SV Index:   41 LVOT Area:     2.54 cm  RIGHT VENTRICLE RV S prime:     14.40 cm/s TAPSE (M-mode): 2.3 cm LEFT ATRIUM             Index        RIGHT ATRIUM          Index LA Vol (A2C):   33.2 ml 19.34 ml/m  RA Area:     9.41 cm LA Vol (A4C):   41.9 ml 24.41 ml/m  RA Volume:   19.80 ml 11.54 ml/m LA Biplane Vol: 38.6 ml 22.49 ml/m  AORTIC VALVE AV Area (Vmax):    1.95 cm AV Area (Vmean):   1.94 cm AV Area (VTI):     2.21 cm AV Vmax:           180.00 cm/s AV Vmean:          135.000 cm/s AV VTI:            0.315 m AV Peak Grad:      13.0  mmHg AV Mean Grad:      8.0 mmHg LVOT Vmax:  138.00 cm/s LVOT Vmean:        103.000 cm/s LVOT VTI:          0.274 m LVOT/AV VTI ratio: 0.87  AORTA Ao Root diam: 2.50 cm MITRAL VALVE MV Area (PHT): 5.70 cm     SHUNTS MV Decel Time: 133 msec     Systemic VTI:  0.27 m MV E velocity: 103.00 cm/s  Systemic Diam: 1.80 cm MV A velocity: 105.00 cm/s MV E/A ratio:  0.98 Arta Lark Electronically signed by Arta Lark Signature Date/Time: 11/08/2023/4:43:25 PM    Final    US  THYROID  Result Date: 11/08/2023 CLINICAL DATA:  Multiple nodules noted on recent CT chest EXAM: THYROID  ULTRASOUND TECHNIQUE: Ultrasound examination of the thyroid  gland and adjacent soft tissues was performed. COMPARISON:  CTA 11/07/2023 FINDINGS: Parenchymal Echotexture: Moderately heterogenous Isthmus: 0.3 cm thickness Right lobe: 4.1 x 2.2 x 2.5 cm Left lobe: 3.7 x 1.3 x 1.3 cm _________________________________________________________ Estimated total number of nodules >/= 1 cm: 4 Number of spongiform nodules >/=  2 cm not described below (TR1): 0 Number of mixed cystic and solid nodules >/= 1.5 cm not described below (TR2): 0 _________________________________________________________ Nodule # 1: Location: Right; inferior Maximum size: 2.49 cm; Other 2 dimensions: 2.1 x 2.4 cm Composition: solid/almost completely solid (2) Echogenicity: isoechoic (1) Shape: not taller-than-wide (0) Margins: ill-defined (0) Echogenic foci: none (0) ACR TI-RADS total points: 3. ACR TI-RADS risk category: TR 3. ACR TI-RADS recommendations: *Given size (>/= 1.5 - 2.4 cm) and appearance, a follow-up ultrasound in 1 year should be considered based on TI-RADS criteria. _________________________________________________________ Nodule # 2: Location: Right; superior Maximum size: 2.4 cm; Other 2 dimensions: 1.6 x 1.8 cm Composition: solid/almost completely solid (2) Echogenicity: hyperechoic (1) Shape: not taller-than-wide (0) Margins: smooth (0) Echogenic  foci: none (0) ACR TI-RADS total points: 3. ACR TI-RADS risk category: TR 3. ACR TI-RADS recommendations: *Given size (>/= 1.5 - 2.4 cm) and appearance, a follow-up ultrasound in 1 year should be considered based on TI-RADS criteria. _________________________________________________________ Nodule # 3: Location: Left; superior Maximum size: 1.2 cm; Other 2 dimensions: 0.8 x 1 cm Composition: solid/almost completely solid (2) Echogenicity: hypoechoic (2) Shape: taller-than-wide (3) Margins: smooth (0) Echogenic foci: none (0) ACR TI-RADS total points: 7. ACR TI-RADS risk category: TR 5. ACR TI-RADS recommendations: **Given size (>/= 1.0 cm) and appearance, fine needle aspiration of this highly suspicious nodule should be considered based on TI-RADS criteria. _________________________________________________________ Nodule # 4: Location: Left; inferior Maximum size: 2 cm; Other 2 dimensions: 1.1 x 1.4 cm Composition: solid/almost completely solid (2) Echogenicity: hypoechoic (2) Shape: not taller-than-wide (0) Margins: smooth (0) Echogenic foci: none (0) ACR TI-RADS total points: 4. ACR TI-RADS risk category: TR 4. ACR TI-RADS recommendations: **Given size (>/= 1.5 cm) and appearance, fine needle aspiration of this moderately suspicious nodule should be considered based on TI-RADS criteria. _________________________________________________________ No regional cervical adenopathy identified. IMPRESSION: 1. Multinodular thyroid . 2. Recommend FNA biopsy of 1.2 cm left superior TR5 and 2 cm left inferior TR4 nodules. 3. Recommend annual/biennial ultrasound follow-up of additional nodules as above, until stability x5 years confirmed. The above is in keeping with the ACR TI-RADS recommendations - J Am Coll Radiol 2017;14:587-595. Electronically Signed   By: Nicoletta Barrier M.D.   On: 11/08/2023 12:34   CT ABDOMEN PELVIS W CONTRAST Result Date: 11/07/2023 CLINICAL DATA:  Sepsis. Tachycardia, shortness of breath, chest pain.  Generalized body aches. EXAM: CT ABDOMEN AND PELVIS WITH CONTRAST TECHNIQUE: Multidetector CT imaging of the  abdomen and pelvis was performed using the standard protocol following bolus administration of intravenous contrast. RADIATION DOSE REDUCTION: This exam was performed according to the departmental dose-optimization program which includes automated exposure control, adjustment of the mA and/or kV according to patient size and/or use of iterative reconstruction technique. CONTRAST:  75mL OMNIPAQUE  IOHEXOL  350 MG/ML SOLN COMPARISON:  03/20/2023 FINDINGS: Lower chest: Dependent atelectasis in the lower lobes. No effusions. Hepatobiliary: No focal hepatic abnormality. Gallbladder unremarkable. Pancreas: No focal abnormality or ductal dilatation. Spleen: No focal abnormality.  Normal size. Adrenals/Urinary Tract: Area of low-density in the lower pole of the right kidney measures 2.4 cm and is new since prior study. There is mild perinephric stranding noted around the lower pole of the right kidney. This is concerning for area of pyelonephritis and possible early abscess formation. No hydronephrosis. Adrenal glands and urinary bladder unremarkable. Stomach/Bowel: Stomach, large and small bowel grossly unremarkable. Vascular/Lymphatic: No evidence of aneurysm or adenopathy. Reproductive: Uterus and adnexa unremarkable.  No mass. Other: No free fluid or free air. Musculoskeletal: No acute bony abnormality. IMPRESSION: Area of low-density in the lower pole of the right kidney concerning for area of pyelonephritis and possible early abscess formation. Surrounding perinephric stranding. Electronically Signed   By: Janeece Mechanic M.D.   On: 11/07/2023 21:12   CT Angio Chest PE W/Cm &/Or Wo Cm Result Date: 11/07/2023 CLINICAL DATA:  Pulmonary embolism (PE) suspected, low to intermediate prob, positive D-dimer. Generalized body aches. Chest pain, shortness of breath, tachycardia. Elevated D-dimer. EXAM: CT ANGIOGRAPHY  CHEST WITH CONTRAST TECHNIQUE: Multidetector CT imaging of the chest was performed using the standard protocol during bolus administration of intravenous contrast. Multiplanar CT image reconstructions and MIPs were obtained to evaluate the vascular anatomy. RADIATION DOSE REDUCTION: This exam was performed according to the departmental dose-optimization program which includes automated exposure control, adjustment of the mA and/or kV according to patient size and/or use of iterative reconstruction technique. CONTRAST:  75mL OMNIPAQUE  IOHEXOL  350 MG/ML SOLN COMPARISON:  None Available. FINDINGS: Cardiovascular: No filling defects in the pulmonary arteries to suggest pulmonary emboli. Heart is normal size. Aorta is normal caliber. Mediastinum/Nodes: No mediastinal, hilar, or axillary adenopathy. Trachea and esophagus are unremarkable. Multiple nodules within the right thyroid  lobe, the largest measuring 2.1 cm. Lungs/Pleura: Scattered ground-glass and dependent opacities in the lower lobes, favor atelectasis. No confluent opacities or effusions. 4 mm right upper lobe nodule on image 55. 2 mm subpleural right middle lobe pulmonary nodule on image 65. no pleural effusions. Upper Abdomen: No acute findings Musculoskeletal: Chest wall soft tissues are unremarkable. No acute bony abnormality. Review of the MIP images confirms the above findings. IMPRESSION: No evidence of pulmonary embolus. Scattered ground-glass and linear opacities in the lower lobes, mostly dependent, likely atelectasis. Small right lung pulmonary nodules measuring up to 4 mm. No follow-up needed if patient is low-risk (and has no known or suspected primary neoplasm). Non-contrast chest CT can be considered in 12 months if patient is high-risk. This recommendation follows the consensus statement: Guidelines for Management of Incidental Pulmonary Nodules Detected on CT Images: From the Fleischner Society 2017; Radiology 2017; 284:228-243. Right thyroid   nodules measuring up to 2.1 cm. Recommend further evaluation with nonemergent thyroid  ultrasound. Electronically Signed   By: Janeece Mechanic M.D.   On: 11/07/2023 21:09   DG Chest 2 View Result Date: 11/07/2023 CLINICAL DATA:  Chest pain EXAM: CHEST - 2 VIEW COMPARISON:  02/06/2009 FINDINGS: Heart and mediastinal contours are within normal limits. No focal opacities  or effusions. No acute bony abnormality. IMPRESSION: No active cardiopulmonary disease. Electronically Signed   By: Janeece Mechanic M.D.   On: 11/07/2023 19:13      LOS: 2 days   Alphonza Ashing, MD [facility] Resident  [facility]   Dr. Valeta Gaudier is the attending of record for this consult.    11/09/2023, 11:08 AM

## 2023-11-09 NOTE — Discharge Summary (Signed)
 Physician Discharge Summary   Patient: Caitlin Hoffman MRN: [id number] DOB: 1975/12/12  Admit date:     11/07/2023  Discharge date: 11/09/23  Discharge Physician: Danette Duos   PCP: [facility]   Recommendations at discharge:    Needs follow up with endocrinologist for thyroid  nodule bx.  Needs repeat TSH and free T3, 4 Needs follow up with GYN for history of of Gynecare TVT mesh.   Discharge Diagnoses: Principal Problem:   Pyelonephritis  Resolved Problems:   * No resolved hospital problems. 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  She was found to be febrile, tachycardic, leukocytosis, UA positive for pyuria.  CT abdomen and pelvis with contrast revealed low density in the lower pole of the right kidney concerning for area of pyelonephritis and possible early abscess formation.    Assessment and Plan: 1-Sepsis secondary to pyelonephritis POA Patient presented with flank pain, fever, leukocytosis, tachycardia.  CT abdomen and pelvis with finding concerning for pyelonephritis or early abscess. -Blood culture: no growth to date.  -FU urine culture. Insignificant growth  -will review Case with urology.  She has history of Gynecare TVT mesh.  A1c 5.3  WBC down to 11, afebrile.  She was receiving Zosyn , plan to discharge on Cipro  for 12 days.  Advised to return to hospital if she develops fever or recurrent pain.   Subclinical hyperthyroidism Incidental finding right thyroid  nodule measuring 2.1 cm US ; Thyroid : Multinodular goiter. She will need FNA of 1.2 cm left superior TR5 and 2 cm Left inferior TR4.  Needs follow up with Endocrinologist. Patient made aware.    History of rheumatic fever diagnosed as a child -she related she was treated with antibiotics.    Incidental finding cardiac  murmur ECHO; No valve abnormalities.   Hypophosphatemia; replaced orally        Consultants: Urology  Procedures performed: none Disposition: Home Diet recommendation:  Discharge Diet Orders (From admission, onward)     Start     Ordered   11/09/23 0000  Diet - low sodium heart healthy        11/09/23 1153           Cardiac diet DISCHARGE MEDICATION: Allergies as of 11/09/2023   No Known Allergies      Medication List     STOP taking these medications    propranolol ER 60 MG 24 hr capsule Commonly known as: INDERAL LA   sulfamethoxazole -trimethoprim  800-160 MG tablet Commonly known as: BACTRIM  DS       TAKE these medications    ciprofloxacin  500 MG tablet Commonly known as: CIPRO  Take 1 tablet (500 mg total) by mouth 2 (two) times daily for 12 days. What changed: when to take this   Lo Loestrin Fe 1 MG-10 MCG / 10 MCG tablet Generic drug: Norethindrone-Ethinyl Estradiol-Fe Biphas Take 1 tablet by mouth daily.   ondansetron  4 MG disintegrating tablet Commonly known as: ZOFRAN -ODT Take 1 tablet (4 mg total) by mouth every 8 (eight) hours as needed for nausea or vomiting.   phosphorus 155-852-130 MG tablet Commonly known as: K PHOS  NEUTRAL Take 2 tablets (500 mg total) by mouth 2 (two) times daily for 2 days.   WOMENS MULTIVITAMIN PO Take by mouth daily.        Follow-up Information     Gordy Lauber, MD Follow up  in 1 week(s).   Specialty: Endocrinology Why: lamme y haga cita Contact information: [address] [place] [address] [phone]                Discharge Exam: Caitlin Hoffman Weights   11/07/23 1700  Weight: 66.7 kg   General; NAD  Condition at discharge: stable  The results of significant diagnostics from this hospitalization (including imaging, microbiology, ancillary and laboratory) are listed below for reference.   Imaging Studies: ECHOCARDIOGRAM COMPLETE Result Date: 11/08/2023     ECHOCARDIOGRAM REPORT   Patient Name:   Caitlin Hoffman Date of Exam: 11/08/2023 Medical Rec #:  [id number]     Height:       64.0 in Accession #:    [id number]    Weight:       147.0 lb Date of Birth:  [birth year]      BSA:          1.716 m Patient Age:    48 years      BP:           120/71 mmHg Patient Gender: F             HR:           94 bpm. Exam Location:  Inpatient Procedure: 2D Echo, Cardiac Doppler and Color Doppler (Both Spectral and Color            Flow Doppler were utilized during procedure). Indications:    Abnormal ECG R94.31  History:        Patient has no prior history of Echocardiogram examinations.  Sonographer:    Astrid Blamer Referring Phys: CAROLE N HALL IMPRESSIONS  1. Left ventricular ejection fraction, by estimation, is 65 to 70%. The left ventricle has normal function. The left ventricle has no regional wall motion abnormalities. Left ventricular diastolic parameters were normal.  2. Right ventricular systolic function is normal. The right ventricular size is normal.  3. The mitral valve is normal in structure. No evidence of mitral valve regurgitation. No evidence of mitral stenosis.  4. The aortic valve is normal in structure. Aortic valve regurgitation is not visualized. No aortic stenosis is present.  5. The inferior vena cava is normal in size with greater than 50% respiratory variability, suggesting right atrial pressure of 3 mmHg. FINDINGS  Left Ventricle: Left ventricular ejection fraction, by estimation, is 65 to 70%. The left ventricle has normal function. The left ventricle has no regional wall motion abnormalities. The left ventricular internal cavity size was normal in size. There is  no left ventricular hypertrophy. Left ventricular diastolic parameters were normal. Right Ventricle: The right ventricular size is normal. No increase in right ventricular wall thickness. Right ventricular systolic function is normal. Left Atrium: Left atrial size was normal in size. Right Atrium:  Right atrial size was normal in size. Pericardium: There is no evidence of pericardial effusion. Mitral Valve: The mitral valve is normal in structure. No evidence of mitral valve regurgitation. No evidence of mitral valve stenosis. Tricuspid Valve: The tricuspid valve is normal in structure. Tricuspid valve regurgitation is trivial. No evidence of tricuspid stenosis. Aortic Valve: The aortic valve is normal in structure. Aortic valve regurgitation is not visualized. No aortic stenosis is present. Aortic valve mean gradient measures 8.0 mmHg. Aortic valve peak gradient measures 13.0 mmHg. Aortic valve area, by VTI measures 2.21 cm. Pulmonic Valve: The pulmonic valve was normal in structure. Pulmonic valve regurgitation is not visualized. No evidence of pulmonic stenosis.  Aorta: The aortic root is normal in size and structure. Venous: The inferior vena cava is normal in size with greater than 50% respiratory variability, suggesting right atrial pressure of 3 mmHg. IAS/Shunts: No atrial level shunt detected by color flow Doppler.  LEFT VENTRICLE PLAX 2D LVIDd:         4.20 cm   Diastology LVIDs:         3.00 cm   LV e' medial:    14.00 cm/s LV PW:         0.80 cm   LV E/e' medial:  7.4 LV IVS:        0.80 cm   LV e' lateral:   11.60 cm/s LVOT diam:     1.80 cm   LV E/e' lateral: 8.9 LV SV:         70 LV SV Index:   41 LVOT Area:     2.54 cm  RIGHT VENTRICLE RV S prime:     14.40 cm/s TAPSE (M-mode): 2.3 cm LEFT ATRIUM             Index        RIGHT ATRIUM          Index LA Vol (A2C):   33.2 ml 19.34 ml/m  RA Area:     9.41 cm LA Vol (A4C):   41.9 ml 24.41 ml/m  RA Volume:   19.80 ml 11.54 ml/m LA Biplane Vol: 38.6 ml 22.49 ml/m  AORTIC VALVE AV Area (Vmax):    1.95 cm AV Area (Vmean):   1.94 cm AV Area (VTI):     2.21 cm AV Vmax:           180.00 cm/s AV Vmean:          135.000 cm/s AV VTI:            0.315 m AV Peak Grad:      13.0 mmHg AV Mean Grad:      8.0 mmHg LVOT Vmax:         138.00 cm/s LVOT Vmean:         103.000 cm/s LVOT VTI:          0.274 m LVOT/AV VTI ratio: 0.87  AORTA Ao Root diam: 2.50 cm MITRAL VALVE MV Area (PHT): 5.70 cm     SHUNTS MV Decel Time: 133 msec     Systemic VTI:  0.27 m MV E velocity: 103.00 cm/s  Systemic Diam: 1.80 cm MV A velocity: 105.00 cm/s MV E/A ratio:  0.98 Arta Lark Electronically signed by Arta Lark Signature Date/Time: 11/08/2023/4:43:25 PM    Final    US  THYROID  Result Date: 11/08/2023 CLINICAL DATA:  Multiple nodules noted on recent CT chest EXAM: THYROID  ULTRASOUND TECHNIQUE: Ultrasound examination of the thyroid  gland and adjacent soft tissues was performed. COMPARISON:  CTA 11/07/2023 FINDINGS: Parenchymal Echotexture: Moderately heterogenous Isthmus: 0.3 cm thickness Right lobe: 4.1 x 2.2 x 2.5 cm Left lobe: 3.7 x 1.3 x 1.3 cm _________________________________________________________ Estimated total number of nodules >/= 1 cm: 4 Number of spongiform nodules >/=  2 cm not described below (TR1): 0 Number of mixed cystic and solid nodules >/= 1.5 cm not described below (TR2): 0 _________________________________________________________ Nodule # 1: Location: Right; inferior Maximum size: 2.49 cm; Other 2 dimensions: 2.1 x 2.4 cm Composition: solid/almost completely solid (2) Echogenicity: isoechoic (1) Shape: not taller-than-wide (0) Margins: ill-defined (0) Echogenic foci: none (0) ACR TI-RADS total points: 3. ACR TI-RADS risk category: TR 3.  ACR TI-RADS recommendations: *Given size (>/= 1.5 - 2.4 cm) and appearance, a follow-up ultrasound in 1 year should be considered based on TI-RADS criteria. _________________________________________________________ Nodule # 2: Location: Right; superior Maximum size: 2.4 cm; Other 2 dimensions: 1.6 x 1.8 cm Composition: solid/almost completely solid (2) Echogenicity: hyperechoic (1) Shape: not taller-than-wide (0) Margins: smooth (0) Echogenic foci: none (0) ACR TI-RADS total points: 3. ACR TI-RADS risk category: TR 3. ACR  TI-RADS recommendations: *Given size (>/= 1.5 - 2.4 cm) and appearance, a follow-up ultrasound in 1 year should be considered based on TI-RADS criteria. _________________________________________________________ Nodule # 3: Location: Left; superior Maximum size: 1.2 cm; Other 2 dimensions: 0.8 x 1 cm Composition: solid/almost completely solid (2) Echogenicity: hypoechoic (2) Shape: taller-than-wide (3) Margins: smooth (0) Echogenic foci: none (0) ACR TI-RADS total points: 7. ACR TI-RADS risk category: TR 5. ACR TI-RADS recommendations: **Given size (>/= 1.0 cm) and appearance, fine needle aspiration of this highly suspicious nodule should be considered based on TI-RADS criteria. _________________________________________________________ Nodule # 4: Location: Left; inferior Maximum size: 2 cm; Other 2 dimensions: 1.1 x 1.4 cm Composition: solid/almost completely solid (2) Echogenicity: hypoechoic (2) Shape: not taller-than-wide (0) Margins: smooth (0) Echogenic foci: none (0) ACR TI-RADS total points: 4. ACR TI-RADS risk category: TR 4. ACR TI-RADS recommendations: **Given size (>/= 1.5 cm) and appearance, fine needle aspiration of this moderately suspicious nodule should be considered based on TI-RADS criteria. _________________________________________________________ No regional cervical adenopathy identified. IMPRESSION: 1. Multinodular thyroid . 2. Recommend FNA biopsy of 1.2 cm left superior TR5 and 2 cm left inferior TR4 nodules. 3. Recommend annual/biennial ultrasound follow-up of additional nodules as above, until stability x5 years confirmed. The above is in keeping with the ACR TI-RADS recommendations - J Am Coll Radiol 2017;14:587-595. Electronically Signed   By: Nicoletta Barrier M.D.   On: 11/08/2023 12:34   CT ABDOMEN PELVIS W CONTRAST Result Date: 11/07/2023 CLINICAL DATA:  Sepsis. Tachycardia, shortness of breath, chest pain. Generalized body aches. EXAM: CT ABDOMEN AND PELVIS WITH CONTRAST TECHNIQUE:  Multidetector CT imaging of the abdomen and pelvis was performed using the standard protocol following bolus administration of intravenous contrast. RADIATION DOSE REDUCTION: This exam was performed according to the departmental dose-optimization program which includes automated exposure control, adjustment of the mA and/or kV according to patient size and/or use of iterative reconstruction technique. CONTRAST:  75mL OMNIPAQUE  IOHEXOL  350 MG/ML SOLN COMPARISON:  03/20/2023 FINDINGS: Lower chest: Dependent atelectasis in the lower lobes. No effusions. Hepatobiliary: No focal hepatic abnormality. Gallbladder unremarkable. Pancreas: No focal abnormality or ductal dilatation. Spleen: No focal abnormality.  Normal size. Adrenals/Urinary Tract: Area of low-density in the lower pole of the right kidney measures 2.4 cm and is new since prior study. There is mild perinephric stranding noted around the lower pole of the right kidney. This is concerning for area of pyelonephritis and possible early abscess formation. No hydronephrosis. Adrenal glands and urinary bladder unremarkable. Stomach/Bowel: Stomach, large and small bowel grossly unremarkable. Vascular/Lymphatic: No evidence of aneurysm or adenopathy. Reproductive: Uterus and adnexa unremarkable.  No mass. Other: No free fluid or free air. Musculoskeletal: No acute bony abnormality. IMPRESSION: Area of low-density in the lower pole of the right kidney concerning for area of pyelonephritis and possible early abscess formation. Surrounding perinephric stranding. Electronically Signed   By: Janeece Mechanic M.D.   On: 11/07/2023 21:12   CT Angio Chest PE W/Cm &/Or Wo Cm Result Date: 11/07/2023 CLINICAL DATA:  Pulmonary embolism (PE) suspected, low to intermediate prob, positive  D-dimer. Generalized body aches. Chest pain, shortness of breath, tachycardia. Elevated D-dimer. EXAM: CT ANGIOGRAPHY CHEST WITH CONTRAST TECHNIQUE: Multidetector CT imaging of the chest was  performed using the standard protocol during bolus administration of intravenous contrast. Multiplanar CT image reconstructions and MIPs were obtained to evaluate the vascular anatomy. RADIATION DOSE REDUCTION: This exam was performed according to the departmental dose-optimization program which includes automated exposure control, adjustment of the mA and/or kV according to patient size and/or use of iterative reconstruction technique. CONTRAST:  75mL OMNIPAQUE  IOHEXOL  350 MG/ML SOLN COMPARISON:  None Available. FINDINGS: Cardiovascular: No filling defects in the pulmonary arteries to suggest pulmonary emboli. Heart is normal size. Aorta is normal caliber. Mediastinum/Nodes: No mediastinal, hilar, or axillary adenopathy. Trachea and esophagus are unremarkable. Multiple nodules within the right thyroid  lobe, the largest measuring 2.1 cm. Lungs/Pleura: Scattered ground-glass and dependent opacities in the lower lobes, favor atelectasis. No confluent opacities or effusions. 4 mm right upper lobe nodule on image 55. 2 mm subpleural right middle lobe pulmonary nodule on image 65. no pleural effusions. Upper Abdomen: No acute findings Musculoskeletal: Chest wall soft tissues are unremarkable. No acute bony abnormality. Review of the MIP images confirms the above findings. IMPRESSION: No evidence of pulmonary embolus. Scattered ground-glass and linear opacities in the lower lobes, mostly dependent, likely atelectasis. Small right lung pulmonary nodules measuring up to 4 mm. No follow-up needed if patient is low-risk (and has no known or suspected primary neoplasm). Non-contrast chest CT can be considered in 12 months if patient is high-risk. This recommendation follows the consensus statement: Guidelines for Management of Incidental Pulmonary Nodules Detected on CT Images: From the Fleischner Society 2017; Radiology 2017; 284:228-243. Right thyroid  nodules measuring up to 2.1 cm. Recommend further evaluation with  nonemergent thyroid  ultrasound. Electronically Signed   By: Janeece Mechanic M.D.   On: 11/07/2023 21:09   DG Chest 2 View Result Date: 11/07/2023 CLINICAL DATA:  Chest pain EXAM: CHEST - 2 VIEW COMPARISON:  02/06/2009 FINDINGS: Heart and mediastinal contours are within normal limits. No focal opacities or effusions. No acute bony abnormality. IMPRESSION: No active cardiopulmonary disease. Electronically Signed   By: Janeece Mechanic M.D.   On: 11/07/2023 19:13    Microbiology: Results for orders placed or performed during the hospital encounter of 11/07/23  Blood Culture (routine x 2)     Status: None (Preliminary result)   Collection Time: 11/07/23  5:28 PM   Specimen: BLOOD  Result Value Ref Range Status   Specimen Description   Final    BLOOD RIGHT ANTECUBITAL Performed at [facility], [address] [place], [place] [address]    Special Requests   Final    BOTTLES DRAWN AEROBIC AND ANAEROBIC Blood Culture results may not be optimal due to an excessive volume of blood received in culture bottles Performed at [facility], [address] [place], [place] [address]    Culture   Final    NO GROWTH 2 DAYS Performed at [facility], [address], [place], [place] [address]    Report Status PENDING  Incomplete  Blood Culture (routine x 2)     Status: None (Preliminary result)   Collection Time: 11/07/23  5:33 PM   Specimen: BLOOD  Result Value Ref Range Status   Specimen Description   Final    BLOOD LEFT ANTECUBITAL Performed at [facility], [address] [place], [place] [address]    Special Requests  Final    BOTTLES DRAWN AEROBIC AND ANAEROBIC Blood Culture adequate volume Performed at [facility], [address] [place], [place] [address]    Culture   Final    NO GROWTH 2 DAYS Performed at [facility], [address] [place], [place] [address]    Report Status PENDING  Incomplete  Resp panel by RT-PCR (RSV, Flu  A&B, Covid) Anterior Nasal Swab     Status: None   Collection Time: 11/07/23  5:37 PM   Specimen: Anterior Nasal Swab  Result Value Ref Range Status   SARS Coronavirus 2 by RT PCR NEGATIVE NEGATIVE Final    Comment: (NOTE) SARS-CoV-2 target nucleic acids are NOT DETECTED.  The SARS-CoV-2 RNA is generally detectable in upper respiratory specimens during the acute phase of infection. The lowest concentration of SARS-CoV-2 viral copies this assay can detect is 138 copies/mL. A negative result does not preclude SARS-Cov-2 infection and should not be used as the sole basis for treatment or other patient management decisions. A negative result may occur with  improper specimen collection/handling, submission of specimen other than nasopharyngeal swab, presence of viral mutation(s) within the areas targeted by this assay, and inadequate number of viral copies(<138 copies/mL). A negative result must be combined with clinical observations, patient history, and epidemiological information. The expected result is Negative.  Fact Sheet for Patients:  BloggerCourse.com  Fact Sheet for Healthcare Providers:  SeriousBroker.it  This test is no t yet approved or cleared by the [place]  FDA and  has been authorized for detection and/or diagnosis of SARS-CoV-2 by FDA under an Emergency Use Authorization (EUA). This EUA will remain  in effect (meaning this test can be used) for the duration of the COVID-19 declaration under Section 564(b)(1) of the Act, 21 U.S.C.section 360bbb-3(b)(1), unless the authorization is terminated  or revoked sooner.       Influenza A by PCR NEGATIVE NEGATIVE Final   Influenza B by PCR NEGATIVE NEGATIVE Final    Comment: (NOTE) The Xpert Xpress SARS-CoV-2/FLU/RSV plus assay is intended as an aid in the diagnosis of influenza from Nasopharyngeal swab specimens and should not be used as a sole basis for treatment.  Nasal washings and aspirates are unacceptable for Xpert Xpress SARS-CoV-2/FLU/RSV testing.  Fact Sheet for Patients: BloggerCourse.com  Fact Sheet for Healthcare Providers: SeriousBroker.it  This test is not yet approved or cleared by the [place]  FDA and has been authorized for detection and/or diagnosis of SARS-CoV-2 by FDA under an Emergency Use Authorization (EUA). This EUA will remain in effect (meaning this test can be used) for the duration of the COVID-19 declaration under Section 564(b)(1) of the Act, 21 U.S.C. section 360bbb-3(b)(1), unless the authorization is terminated or revoked.     Resp Syncytial Virus by PCR NEGATIVE NEGATIVE Final    Comment: (NOTE) Fact Sheet for Patients: BloggerCourse.com  Fact Sheet for Healthcare Providers: SeriousBroker.it  This test is not yet approved or cleared by the [place]  FDA and has been authorized for detection and/or diagnosis of SARS-CoV-2 by FDA under an Emergency Use Authorization (EUA). This EUA will remain in effect (meaning this test can be used) for the duration of the COVID-19 declaration under Section 564(b)(1) of the Act, 21 U.S.C. section 360bbb-3(b)(1), unless the authorization is terminated or revoked.  Performed at [facility], [address] [place], [place] [address]   Urine Culture     Status: Abnormal   Collection Time: 11/07/23  5:37  PM   Specimen: Urine, Clean Catch  Result Value Ref Range Status   Specimen Description   Final    URINE, CLEAN CATCH Performed at [facility], [address] [place], [place] [address]    Special Requests   Final    NONE Performed at [facility], [address] [place], [place] [address]    Culture (A)  Final    <10,000 COLONIES/mL INSIGNIFICANT GROWTH Performed at [facility], [address], [place],  [place] [address]    Report Status 11/08/2023 FINAL  Final    Labs: CBC: Recent Labs  Lab 11/07/23 1706 11/07/23 1737 11/08/23 0850 11/09/23 0918  WBC 23.9*  --  19.3* 11.5*  NEUTROABS  --  20.1*  --   --   HGB 13.0  --  12.1 11.5*  HCT 38.8  --  36.2 35.0*  MCV 86.4  --  86.8 86.8  PLT 305  --  260 284   Basic Metabolic Panel: Recent Labs  Lab 11/07/23 1706 11/08/23 0850 11/09/23 0918  NA 135 136 136  K 3.8 3.8 3.6  CL 100 107 102  CO2 20* 20* 21*  GLUCOSE 156* 113* 149*  BUN 8 <5* 7  CREATININE 0.89 0.56 0.65  CALCIUM 8.9 8.6* 8.7*  MG  --  1.9  --   PHOS  --  2.4* 3.7   Liver Function Tests: Recent Labs  Lab 11/07/23 1737  AST 17  ALT 11  ALKPHOS 119  BILITOT 0.6  PROT 7.7  ALBUMIN 4.0   CBG: No results for input(s): "GLUCAP" in the last 168 hours.  Discharge time spent: greater than 30 minutes.  Signed: Danette Duos, MD [facility] 11/09/2023

## 2023-11-09 NOTE — Progress Notes (Signed)
 Reviewed AVS, patient expressed understanding of medications, MD follow up reviewed.    Patient states all belongings brought to the hospital at time of admission are accounted for and packed to take home.  Patient informed and expressed understanding of where to pickup discharge medications.  Pt denied assistance to entrance A; patient requested to walk to entrance A with family member at her side.

## 2023-11-09 NOTE — Progress Notes (Signed)
 Interventional Radiology Brief Note:  Caitlin Hoffman is a 48 year old female admitted with fever and right flank pain.  CT Abdomen Pelvis 11/07/23 shows a small area of low-density in the right lower kidney.  IR consulted for aspiration vs. Drainage.  Case reviewed by Dr. Julietta Ogren.  Unfortunately the area in question may represent early abscess formation, but is small with no drainable component at this time. Recommend continue current management for now.  Zakhia Seres, MS RD PA-C

## 2023-11-12 LAB — CULTURE, BLOOD (ROUTINE X 2)
Culture: NO GROWTH
Culture: NO GROWTH
Special Requests: ADEQUATE

## 2023-12-10 ENCOUNTER — Ambulatory Visit: Attending: Obstetrics and Gynecology | Admitting: Physical Therapy

## 2024-02-18 ENCOUNTER — Encounter (INDEPENDENT_AMBULATORY_CARE_PROVIDER_SITE_OTHER): Payer: Self-pay

## 2024-03-23 ENCOUNTER — Encounter (INDEPENDENT_AMBULATORY_CARE_PROVIDER_SITE_OTHER): Payer: Self-pay | Admitting: Otolaryngology

## 2024-03-23 ENCOUNTER — Ambulatory Visit (INDEPENDENT_AMBULATORY_CARE_PROVIDER_SITE_OTHER): Admitting: Otolaryngology

## 2024-03-23 VITALS — BP 123/73 | HR 72 | Temp 98.4°F | Ht 64.0 in | Wt 157.0 lb

## 2024-03-23 DIAGNOSIS — E042 Nontoxic multinodular goiter: Secondary | ICD-10-CM

## 2024-03-23 DIAGNOSIS — E049 Nontoxic goiter, unspecified: Secondary | ICD-10-CM

## 2024-03-23 NOTE — Progress Notes (Signed)
 ENT CONSULT:  Reason for Consult: thyroid  nodules    HPI: Discussed the use of AI scribe software for clinical note transcription with the patient, who gave verbal consent to proceed.  History of Present Illness Caitlin Hoffman is a 48 year old female who presents with multiple thyroid  nodules for evaluation and management.  She has been diagnosed with multiple thyroid  nodules, with two nodules on the left side requiring biopsy due to appearance and size on ultrasound. No symptoms of pressure or difficulty swallowing.  During a previous hospitalization for sepsis, her TSH levels were found to be lower than normal. However, she has not had additional labs done since her hospital discharge.  There is no family history of thyroid  cancer, and she has not received any radiation to her neck.  Records Reviewed:  D/c summary 11/07/23  48 year old with past medical history significant for rheumatoid fever diagnosed as a child presented with generalized body aches, right flank pain that is started the day prior to admission.  She was found to be febrile, tachycardic, leukocytosis, UA positive for pyuria.  CT abdomen and pelvis with contrast revealed low density in the lower pole of the right kidney concerning for area of pyelonephritis and possible early abscess formation.    Past Medical History:  Diagnosis Date   Anxiety    Depression    Genital herpes    GERD (gastroesophageal reflux disease)    History of hyperthyroidism    previously seen by endocrinologist, dr kassie (lov in epic 02-23-2009) stated very mild then resolved after normal labs   Hyperthyroidism    Osteopenia    SUI (stress urinary incontinence, female)    Wears contact lenses    Wears contact lenses     Past Surgical History:  Procedure Laterality Date   BLADDER SUSPENSION N/A 08/22/2020   Procedure: TRANSVAGINAL TAPE (TVT) PROCEDURE;  Surgeon: Bettina Muskrat, MD;  Location: [facility];  Service: Gynecology;   Laterality: N/A;   CYSTOSCOPY N/A 08/22/2020   Procedure: CYSTOSCOPY;  Surgeon: Bettina Muskrat, MD;  Location: [facility];  Service: Gynecology;  Laterality: N/A;   NO PAST SURGERIES      Family History  Problem Relation Age of Onset   Gastric cancer Mother        Stomach cancer?   BRCA 1/2 Neg Hx    Breast cancer Neg Hx     Social History:  reports that she has been smoking cigarettes. She started smoking about 18 years ago. She has never used smokeless tobacco. She reports current alcohol use. She reports that she does not use drugs.  Allergies: No Known Allergies  Medications: I have reviewed the patient's current medications.  The PMH, PSH, Medications, Allergies, and SH were reviewed and updated.  ROS: Constitutional: Negative for fever, weight loss and weight gain. Cardiovascular: Negative for chest pain and dyspnea on exertion. Respiratory: Is not experiencing shortness of breath at rest. Gastrointestinal: Negative for nausea and vomiting. Neurological: Negative for headaches. Psychiatric: The patient is not nervous/anxious  Blood pressure 123/73, pulse 72, temperature 98.4 F (36.9 C), height 5' 4 (1.626 m), weight 157 lb (71.2 kg), SpO2 98%. Body mass index is 26.95 kg/m.  PHYSICAL EXAM:  Exam: General: Well-developed, well-nourished Respiratory Respiratory effort: Equal inspiration and expiration without stridor Cardiovascular Peripheral Vascular: Warm extremities with equal color/perfusion Eyes: No nystagmus with equal extraocular motion bilaterally Neuro/Psych/Balance: Patient oriented to person, place, and time; Appropriate mood and affect; Gait is intact with no imbalance; Cranial  nerves I-XII are intact Head and Face Inspection: Normocephalic and atraumatic without mass or lesion Palpation: Facial skeleton intact without bony stepoffs Salivary Glands: No mass or tenderness Facial Strength: Facial motility symmetric and full  bilaterally ENT Pinna: External ear intact and fully developed External canal: Canal is patent with intact skin Tympanic Membrane: Clear and mobile External Nose: No scar or anatomic deformity Internal Nose: Septum is straight. No polyp, or purulence. Mucosal edema and erythema present.  Bilateral inferior turbinate hypertrophy.  Lips, Teeth, and gums: Mucosa and teeth intact and viable TMJ: No pain to palpation with full mobility Oral cavity/oropharynx: No erythema or exudate, no lesions present Larynx See procedure b/l VF are mobile Neck Neck and Trachea: Midline trachea without mass or lesion Thyroid : No mass or nodularity Lymphatics: No lymphadenopathy  Procedure:   Preoperative diagnosis: thyroid  goiter  Postoperative diagnosis:   Same  Procedure: Flexible fiberoptic laryngoscopy  Surgeon: Elena Larry, MD  Anesthesia: Topical lidocaine  and Afrin Complications: None Condition is stable throughout exam  Indications and consent:  The patient presents to the clinic with above symptoms. Indirect laryngoscopy view was incomplete. Thus it was recommended that they undergo a flexible fiberoptic laryngoscopy. All of the risks, benefits, and potential complications were reviewed with the patient preoperatively and verbal informed consent was obtained.  Procedure: The patient was seated upright in the clinic. Topical lidocaine  and Afrin were applied to the nasal cavity. After adequate anesthesia had occurred, I then proceeded to pass the flexible telescope into the nasal cavity. The nasal cavity was patent without rhinorrhea or polyp. The nasopharynx was also patent without mass or lesion. The base of tongue was visualized and was normal. There were no signs of pooling of secretions in the piriform sinuses. The true vocal folds were mobile bilaterally. There were no signs of glottic or supraglottic mucosal lesion or mass. There was moderate interarytenoid pachydermia and post cricoid  edema. The telescope was then slowly withdrawn and the patient tolerated the procedure throughout.   Studies Reviewed: Thyroid  U/S 11/08/23 IMPRESSION: 1. Multinodular thyroid . 2. Recommend FNA biopsy of 1.2 cm left superior TR5 and 2 cm left inferior TR4 nodules. 3. Recommend annual/biennial ultrasound follow-up of additional nodules as above, until stability x5 years confirmed.  Labs: low TSH 4 months ago, normal T3/T4  Assessment/Plan: Encounter Diagnoses  Name Primary?   Multiple thyroid  nodules Yes   Thyroid  goiter     Assessment and Plan Assessment & Plan  multinodular goiter with nodules requiring biopsy Multiple thyroid  nodules with two on the left side needing biopsy due to ultrasound findings. TSH was low during hospitalization, did not have it checked again. No family history of thyroid  cancer or neck radiation. No significant pressure symptoms. Normal vocal cord movement on flexible scope exam.  - Order biopsy for two left thyroid  nodules. - Order repeat TSH  - Provided Radiology contact for biopsy scheduling if delayed. - Schedule follow-up in two months, adjustable based on biopsy results.   Thank you for allowing me to participate in the care of this patient. Please do not hesitate to contact me with any questions or concerns.   Elena Larry, MD Otolaryngology [facility] Phone: [phone] Fax: [phone]    03/23/2024, 10:46 AM

## 2024-03-26 ENCOUNTER — Other Ambulatory Visit (INDEPENDENT_AMBULATORY_CARE_PROVIDER_SITE_OTHER): Payer: Self-pay | Admitting: Otolaryngology

## 2024-03-26 DIAGNOSIS — E049 Nontoxic goiter, unspecified: Secondary | ICD-10-CM

## 2024-03-26 DIAGNOSIS — E042 Nontoxic multinodular goiter: Secondary | ICD-10-CM

## 2024-03-31 ENCOUNTER — Ambulatory Visit
Admission: RE | Admit: 2024-03-31 | Discharge: 2024-03-31 | Disposition: A | Discharge status: Home / Self Care | Source: Ambulatory Visit | Attending: Otolaryngology | Admitting: Otolaryngology

## 2024-03-31 ENCOUNTER — Other Ambulatory Visit (HOSPITAL_COMMUNITY)
Admission: RE | Admit: 2024-03-31 | Discharge: 2024-03-31 | Disposition: A | Discharge status: Home / Self Care | Source: Ambulatory Visit | Attending: Otolaryngology | Admitting: Otolaryngology

## 2024-03-31 ENCOUNTER — Ambulatory Visit
Admission: RE | Admit: 2024-03-31 | Discharge: 2024-03-31 | Disposition: A | Source: Ambulatory Visit | Attending: Otolaryngology

## 2024-03-31 DIAGNOSIS — E041 Nontoxic single thyroid nodule: Secondary | ICD-10-CM

## 2024-03-31 DIAGNOSIS — E049 Nontoxic goiter, unspecified: Secondary | ICD-10-CM

## 2024-03-31 DIAGNOSIS — E042 Nontoxic multinodular goiter: Secondary | ICD-10-CM

## 2024-04-02 LAB — CYTOLOGY - NON PAP

## 2024-04-29 ENCOUNTER — Other Ambulatory Visit: Payer: Self-pay | Admitting: Obstetrics and Gynecology

## 2024-04-29 DIAGNOSIS — Z1231 Encounter for screening mammogram for malignant neoplasm of breast: Secondary | ICD-10-CM

## 2024-05-27 ENCOUNTER — Ambulatory Visit (INDEPENDENT_AMBULATORY_CARE_PROVIDER_SITE_OTHER): Admitting: Otolaryngology

## 2024-05-28 ENCOUNTER — Inpatient Hospital Stay
Admission: RE | Admit: 2024-05-28 | Discharge: 2024-05-28 | Attending: Obstetrics and Gynecology | Admitting: Obstetrics and Gynecology

## 2024-05-28 DIAGNOSIS — Z1231 Encounter for screening mammogram for malignant neoplasm of breast: Secondary | ICD-10-CM

## 2024-05-29 ENCOUNTER — Ambulatory Visit (INDEPENDENT_AMBULATORY_CARE_PROVIDER_SITE_OTHER)
